# Patient Record
Sex: Female | Born: 1990 | Race: Black or African American | Hispanic: No | Marital: Single | Smoking: Current every day smoker
Health system: Southern US, Community
[De-identification: ages and names within clinical notes are randomized; demographics above are authoritative.]

## PROBLEM LIST (undated history)

## (undated) DIAGNOSIS — N611 Abscess of the breast and nipple: Secondary | ICD-10-CM

## (undated) DIAGNOSIS — K589 Irritable bowel syndrome without diarrhea: Secondary | ICD-10-CM

## (undated) DIAGNOSIS — Z8709 Personal history of other diseases of the respiratory system: Secondary | ICD-10-CM

## (undated) HISTORY — DX: Irritable bowel syndrome, unspecified: K58.9

## (undated) HISTORY — DX: Abscess of the breast and nipple: N61.1

## (undated) HISTORY — PX: INCISION AND DRAINAGE BREAST ABSCESS: SUR672

---
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
## 2010-09-15 LAB — URINALYSIS, ROUTINE W REFLEX MICROSCOPIC
Nitrite: NEGATIVE
Protein, ur: 30 mg/dL — AB
Urobilinogen, UA: 1 mg/dL (ref 0.0–1.0)

## 2010-09-15 LAB — WET PREP, GENITAL

## 2010-09-15 LAB — URINE MICROSCOPIC-ADD ON

## 2010-09-15 LAB — POCT PREGNANCY, URINE: Preg Test, Ur: NEGATIVE

## 2010-09-15 LAB — RPR: RPR Ser Ql: NONREACTIVE

## 2011-02-24 ENCOUNTER — Emergency Department (HOSPITAL_COMMUNITY): Payer: BC Managed Care – PPO

## 2011-02-24 ENCOUNTER — Other Ambulatory Visit (HOSPITAL_COMMUNITY): Payer: Self-pay

## 2011-02-24 ENCOUNTER — Emergency Department (HOSPITAL_COMMUNITY)
Admission: EM | Admit: 2011-02-24 | Discharge: 2011-02-24 | Disposition: A | Discharge status: Home / Self Care | Payer: BC Managed Care – PPO | Attending: Emergency Medicine | Admitting: Emergency Medicine

## 2011-02-24 DIAGNOSIS — N644 Mastodynia: Secondary | ICD-10-CM | POA: Insufficient documentation

## 2011-02-24 DIAGNOSIS — N6009 Solitary cyst of unspecified breast: Secondary | ICD-10-CM

## 2011-02-24 LAB — DIFFERENTIAL
Eosinophils Absolute: 0.5 10*3/uL (ref 0.0–0.7)
Eosinophils Relative: 3 % (ref 0–5)
Lymphs Abs: 2.7 10*3/uL (ref 0.7–4.0)
Monocytes Relative: 8 % (ref 3–12)

## 2011-02-24 LAB — BASIC METABOLIC PANEL
Calcium: 9.4 mg/dL (ref 8.4–10.5)
Creatinine, Ser: 0.77 mg/dL (ref 0.50–1.10)
GFR calc Af Amer: >60 mL/min (ref >60)
GFR calc non Af Amer: >60 mL/min (ref >60)

## 2011-02-24 LAB — CBC
MCH: 26.7 pg (ref 26.0–34.0)
MCV: 77.8 fL — ABNORMAL LOW (ref 78.0–100.0)
Platelets: 311 10*3/uL (ref 150–400)
RDW: 14.3 % (ref 11.5–15.5)

## 2011-02-25 ENCOUNTER — Other Ambulatory Visit: Payer: Self-pay | Admitting: Family Medicine

## 2011-02-25 ENCOUNTER — Ambulatory Visit: Admit: 2011-02-25 | Payer: BC Managed Care – PPO

## 2011-02-25 ENCOUNTER — Ambulatory Visit
Admission: RE | Admit: 2011-02-25 | Discharge: 2011-02-25 | Disposition: A | Discharge status: Home / Self Care | Payer: BC Managed Care – PPO | Source: Ambulatory Visit | Attending: Family Medicine | Admitting: Family Medicine

## 2011-02-25 DIAGNOSIS — N6009 Solitary cyst of unspecified breast: Secondary | ICD-10-CM

## 2011-02-26 ENCOUNTER — Ambulatory Visit (INDEPENDENT_AMBULATORY_CARE_PROVIDER_SITE_OTHER): Payer: BC Managed Care – PPO | Admitting: Surgery

## 2011-02-26 ENCOUNTER — Ambulatory Visit (HOSPITAL_COMMUNITY)
Admission: AD | Admit: 2011-02-26 | Discharge: 2011-02-27 | Disposition: A | Discharge status: Home / Self Care | Payer: BC Managed Care – PPO | Source: Ambulatory Visit | Attending: General Surgery | Admitting: General Surgery

## 2011-02-26 ENCOUNTER — Encounter (INDEPENDENT_AMBULATORY_CARE_PROVIDER_SITE_OTHER): Payer: Self-pay | Admitting: Surgery

## 2011-02-26 ENCOUNTER — Ambulatory Visit
Admission: RE | Admit: 2011-02-26 | Discharge: 2011-02-26 | Disposition: A | Discharge status: Home / Self Care | Payer: BC Managed Care – PPO | Source: Ambulatory Visit | Attending: Family Medicine | Admitting: Family Medicine

## 2011-02-26 ENCOUNTER — Other Ambulatory Visit: Payer: Self-pay | Admitting: Family Medicine

## 2011-02-26 VITALS — BP 116/72 | HR 90 | Temp 96.0°F | Resp 18 | Ht 62.0 in | Wt 130.4 lb

## 2011-02-26 DIAGNOSIS — N61 Mastitis without abscess: Secondary | ICD-10-CM

## 2011-02-26 DIAGNOSIS — N6009 Solitary cyst of unspecified breast: Secondary | ICD-10-CM

## 2011-02-26 DIAGNOSIS — N611 Abscess of the breast and nipple: Secondary | ICD-10-CM

## 2011-02-26 DIAGNOSIS — J45909 Unspecified asthma, uncomplicated: Secondary | ICD-10-CM | POA: Insufficient documentation

## 2011-02-26 LAB — BASIC METABOLIC PANEL
Calcium: 9.8 mg/dL (ref 8.4–10.5)
GFR calc non Af Amer: >60 mL/min (ref >60)
Sodium: 132 mEq/L — ABNORMAL LOW (ref 135–145)

## 2011-02-26 LAB — SURGICAL PCR SCREEN
MRSA, PCR: NEGATIVE
Staphylococcus aureus: NEGATIVE

## 2011-02-26 NOTE — Patient Instructions (Signed)
Go directly to admitting at [facility].  Do not eat or drink anything.

## 2011-02-26 NOTE — Progress Notes (Signed)
Chief Complaint  Patient presents with  . Other    eval of right breast abscess     HPI Rachael Manning is a 20 y.o. femalePresents with a one-month history of a right breast abscess. This was initially treated at [facility] in [place] with antibiotics. The pain and erythema improved but the mass remained. Last week she started her period and this area flared up again. It has become exquisitely tender and swollen. She had a visit to the nurse department the other night where she underwent an ultrasound showing a 2-1/2 cm complex cystic lesion consistent with an abscess. An ultrasound-guided aspiration was attempted this morning but the patient is unable to tolerate this due to pain. She was sent to urgent office but again she is unable to tolerate any intervention due to the pain.HPI   Past Medical History  Diagnosis Date  . Asthma   . Chest pain     due to breast abscess   . Breast abscess     right breast   . Fever     Past Surgical History  Procedure Date  . Femur surgery 2007    femur reconstruction     History reviewed. No pertinent family history.  Social History History  Substance Use Topics  . Smoking status: Never Smoker   . Smokeless tobacco: Not on file  . Alcohol Use: No    No Known Allergies  Current Outpatient Prescriptions  Medication Sig Dispense Refill  . cephALEXin (KEFLEX) 500 MG capsule Take 500 mg by mouth 4 (four) times daily.        Marland Kitchen sulfamethoxazole-trimethoprim (BACTRIM DS) 800-160 MG per tablet Take 1 tablet by mouth 2 (two) times daily.          Review of Systems Review of Systems Right breast pain, otherwise negative Blood pressure 116/72, pulse 90, temperature 96 F (35.6 C), resp. rate 18, height 5\' 2"  (1.575 m), weight 130 lb 6 oz (59.138 kg).  Physical Exam Physical Exam WDWN in NAD Right breast - entire periareolar region is swollen and erythematous; exquisitely tender to palpation; fluctuance located at  3:00. Left breast normal Lungs - CTA B CV - RRR Abd - soft, NT  Data Reviewed Right breast ultrasound  Assessment    Right breast abscess - unable to tolerate drainage in the office    Plan    Admit to [facility] for incision and drainage under anesthesia tonight.       Frutoso Dimare K. 02/26/2011, 3:44 PM

## 2011-03-03 LAB — CULTURE, ROUTINE-ABSCESS

## 2011-03-03 NOTE — Op Note (Signed)
  [name], [name]          ACCOUNT NO.:  [id number]  MEDICAL RECORD NO.:  [id number]  LOCATION:  1526                         FACILITY:  [facility]  PHYSICIAN:  Thornton Park. Daphine Deutscher, MD  DATE OF BIRTH:  [birth year]  DATE OF PROCEDURE:  02/26/2011 DATE OF DISCHARGE:                              OPERATIVE REPORT   PREOPERATIVE INDICATIONS:  This is a 20 year old African American female from [place] who is a Consulting civil engineer at [employer], had a breast cellulitis/abscess treated with antibiotics, done in [place] last week, but this has gotten worse and pointed.  She was seen at the Breast Center earlier today, attempted aspiration, but she was very tender and was referred via our General office for breast abscess drainage.  DIAGNOSIS:  Right retroareolar breast abscess.  PROCEDURE:  Incision and drainage and culture of abscess with placement of iodophor packing.  ANESTHESIA:  General.  SURGEON:  Thornton Park. Daphine Deutscher, MD  DESCRIPTION OF PROCEDURE:  The patient was taken to OR #1 on Friday evening, February 26, 2011.  The breast was prepped with PCMX and draped sterilely.  It had been marked in the holding area that was obvious because of the degree of inflammation and there was an obvious abscess situated more medially in the breast.  We had frozen and inserted an 18-gauge needle and aspirated 2 cc of yellow creamy pus.  I placed this in an aerobic and anaerobic culture medium for cultures.  I then incised in a circumareolar fashion into this abscess and got plenty of pus.  I irrigated with saline and pressed it to try to get all this off out, and then irrigated more and then packed it with Iodoform gauze 1/4-inch about 4 inches in length.  Dressings were applied.  The patient was taken to the recovery room in satisfactory addition.  She will be kept overnight and given Zosyn IV and followed up for possible discharge tomorrow.     Thornton Park Daphine Deutscher, MD     MBM/MEDQ  D:  02/26/2011   T:  02/27/2011  Job:  [id number]  Electronically Signed by Luretha Murphy MD on 03/03/2011 07:32:49 AM

## 2011-03-05 LAB — ANAEROBIC CULTURE

## 2011-03-08 ENCOUNTER — Ambulatory Visit (INDEPENDENT_AMBULATORY_CARE_PROVIDER_SITE_OTHER): Payer: BC Managed Care – PPO | Admitting: Family Medicine

## 2011-03-08 ENCOUNTER — Encounter: Payer: Self-pay | Admitting: Family Medicine

## 2011-03-08 VITALS — BP 118/72 | HR 80 | Temp 98.8°F | Ht 62.0 in | Wt 130.5 lb

## 2011-03-08 DIAGNOSIS — N61 Mastitis without abscess: Secondary | ICD-10-CM

## 2011-03-08 DIAGNOSIS — N611 Abscess of the breast and nipple: Secondary | ICD-10-CM

## 2011-03-08 NOTE — Patient Instructions (Signed)
If you have any fevers, chills, nausea, abdominal pain make sure to call her to the emergency department. If you notice any redness, swelling, hardness, pus coming from around your nipple be sure to give [place] a call, come back or go to emergency department

## 2011-03-08 NOTE — Assessment & Plan Note (Signed)
Improving, no current infection. Healing well. Minimal amounts of wick still present. She is to continue her daily dressing changes. Recommended she removed the wick totally by middle this week. She is very hesitant to remove in clinic today due to the pain. Pain is controlled with Percocet though she does not like the Percocet because she states it makes her feel sick to her stomach and lightheaded. Recommend she cut Percocet in half and instructed how to do this. Also recommended she try 2 Tylenol or to 3 ibuprofen if she does not want to continue the Percocet. Gave warnings, red flags, reasons to return to clinic.

## 2011-03-08 NOTE — Progress Notes (Signed)
  Subjective:    Patient ID: Rachael Manning, female    DOB: Apr 26, 1991, 19 y.o.   MRN: [id number]  HPI 1.  Breast abscess:  [name] is a 20 year old female from [place] who is here in town attending [employer]. She had a breast abscess that started in June of this past year. She was started on oral antibiotics by her PCP in [place]. The abscess diminished but did not totally resolve. The middle of September the abscess return in her right breast became red, warm, very swollen, and there and tender to touch. She was initially seen at [facility] emergency department and referred to the breast center. In the breast Center she was referred to surgery and had incision and drainage of her abscess under anesthesia on September 22. She is referred here [facility] clinic by surgery.  Since the incision and drainage of her abscess, she's had no fevers, abdominal pain, nausea, vomiting. She states that her breast's still somewhat tender to touch but otherwise doing well. Feels much better since the drainage. Denies any redness or induration at the site.   Review of Systems See HPI above for review of systems.       Objective:   Physical Exam  Gen:  Alert, cooperative patient who appears stated age in no acute distress.  Vital signs reviewed. Skin:  Bandage in place of her right breast. It is clean dry and intact. Upon removal of bandage, noted iodoform packing with a wick extending from 1 cm in size, well-healing incised area medial to Right areola.  No induration or areas of fluctuance noted. No erythema no axillary tenderness or edema. Minimal tenderness to palpation      Assessment & Plan:

## 2011-03-11 ENCOUNTER — Other Ambulatory Visit: Payer: BC Managed Care – PPO

## 2011-03-12 ENCOUNTER — Other Ambulatory Visit: Payer: BC Managed Care – PPO

## 2011-06-19 ENCOUNTER — Emergency Department (HOSPITAL_COMMUNITY)
Admission: EM | Admit: 2011-06-19 | Discharge: 2011-06-19 | Disposition: A | Discharge status: Home / Self Care | Payer: BC Managed Care – PPO | Attending: Emergency Medicine | Admitting: Emergency Medicine

## 2011-06-19 DIAGNOSIS — N61 Mastitis without abscess: Secondary | ICD-10-CM | POA: Insufficient documentation

## 2011-06-19 DIAGNOSIS — N611 Abscess of the breast and nipple: Secondary | ICD-10-CM

## 2011-06-19 MED ORDER — DOXYCYCLINE HYCLATE 100 MG PO CAPS: 100.0000 mg | ORAL_CAPSULE | Freq: Two times a day (BID) | ORAL | Status: AC

## 2011-06-19 MED ORDER — HYDROCODONE-ACETAMINOPHEN 5-325 MG PO TABS: 2.0000 | ORAL_TABLET | ORAL | Status: AC | PRN

## 2011-06-19 NOTE — ED Notes (Signed)
Pt had a breast lump removed back in September, incision site well healed, pt believes the lump is back. Right breast is tender at the areolar area. Extreme tender to the touch.

## 2011-06-19 NOTE — ED Provider Notes (Signed)
History     CSN: [id number]  Arrival date & time 06/19/11  1105   First MD Initiated Contact with Patient 06/19/11 1157      No chief complaint on file.   (Consider location/radiation/quality/duration/timing/severity/associated sxs/prior treatment) HPI Comments: Patient reports that she has an abscess of her right breast in September, 2012.  An incision and drainage of the abscess was performed by General Surgery under anaesthesia.  She reports that the abscess did resolve after the procedure.  However, two days ago she noticed the abscess.  She reports that the area is tender to palpation and has become increasingly larger.  She denies any drainage.  No fever/chills.  The history is provided by the patient.    Past Medical History  Diagnosis Date  . Asthma   . Chest pain     due to breast abscess   . Breast abscess     right breast   . Fever     Past Surgical History  Procedure Date  . Femur surgery 2007    femur reconstruction     No family history on file.  History  Substance Use Topics  . Smoking status: Never Smoker   . Smokeless tobacco: Not on file  . Alcohol Use: No    OB History    Grav Para Term Preterm Abortions TAB SAB Ect Mult Living                  Review of Systems  Constitutional: Negative for fever and chills.  Respiratory: Negative for shortness of breath.   Cardiovascular: Negative for chest pain.  Gastrointestinal: Negative for nausea and vomiting.  Skin: Negative for color change, rash and wound.  Neurological: Negative for dizziness and syncope.    Allergies  Review of patient's allergies indicates no known allergies.  Home Medications   Current Outpatient Rx  Name Route Sig Dispense Refill  . SULFAMETHOXAZOLE-TMP DS 800-160 MG PO TABS Oral Take 1 tablet by mouth 2 (two) times daily.        BP 104/62  Pulse 93  Temp(Src) 99.1 F (37.3 C) (Oral)  Resp 18  SpO2 100%  LMP 06/03/2011  Physical Exam  Nursing note and  vitals reviewed. Constitutional: She is oriented to person, place, and time. She appears well-developed and well-nourished. No distress.  HENT:  Head: Normocephalic and atraumatic.  Cardiovascular: Normal rate, regular rhythm and normal heart sounds.   Pulmonary/Chest: Effort normal and breath sounds normal. No respiratory distress. Right breast exhibits mass and tenderness. Right breast exhibits no inverted nipple, no nipple discharge and no skin change.       Approximately 3 cm abscess of the right breast.  Abscess located at the medial portion of areola at 3 o'clock.  No surrounding erythema.  No drainage from the area.  Area tender to palpation.  Neurological: She is alert and oriented to person, place, and time.  Skin: Skin is warm and dry. She is not diaphoretic.  Psychiatric: She has a normal mood and affect.    ED Course  Procedures (including critical care time)  Labs Reviewed - No data to display No results found.   No diagnosis found.   Patient reported that she did not want I&D performed in the Emergency Department because she would like to be sedated during the procedure.    MDM  Patient refused I & D in the Emergency Department.  Patient placed on Doxycycline and instructed to follow up with General Surgery for treatment.  Pascal Lux Wingen 06/19/11 1628

## 2011-06-20 NOTE — ED Provider Notes (Signed)
Medical screening examination/treatment/procedure(s) were performed by non-physician practitioner and as supervising physician I was immediately available for consultation/collaboration.   Suzi Roots, MD 06/20/11 [phone]

## 2011-06-21 ENCOUNTER — Ambulatory Visit (INDEPENDENT_AMBULATORY_CARE_PROVIDER_SITE_OTHER): Payer: BC Managed Care – PPO | Admitting: General Surgery

## 2011-06-21 ENCOUNTER — Encounter (INDEPENDENT_AMBULATORY_CARE_PROVIDER_SITE_OTHER): Payer: Self-pay | Admitting: General Surgery

## 2011-06-21 VITALS — BP 111/81 | HR 81 | Temp 96.8°F | Resp 14 | Ht 63.0 in | Wt 135.0 lb

## 2011-06-21 DIAGNOSIS — N611 Abscess of the breast and nipple: Secondary | ICD-10-CM

## 2011-06-21 DIAGNOSIS — N61 Mastitis without abscess: Secondary | ICD-10-CM

## 2011-06-21 NOTE — Progress Notes (Signed)
Patient ID: Rachael Manning, female   DOB: 02/10/1991, 21 y.o.   MRN: [id number]  Chief Complaint  Patient presents with  . Breast Pain    HPI Rachael Manning is a 21 y.o. female.   HPI This is a 21-year-old female who last year had about a one-month history of a chronic subareolar right breast abscess. This was attempted to be treated with antibiotics. This did not work. She then underwent an attempt at ultrasound-guided aspiration but she cannot tolerate this due to pain. She had an ultrasound that showed a 2 and a centimeter complex cystic lesion at that point it September she was then admitted to the hospital by Dr. Morgan and underwent incision and drainage of a chronic right breast subareolar abscess. This was left open and healed over some time. Over the past week this area has recurred now. She reports a serious tender. She has no evidence of any nipple discharge. She denies any systemic findings such as fever. She comes in today to discuss her options. She states that she cannot tolerate an ultrasound-guided aspiration.  Past Medical History  Diagnosis Date  . Asthma   . Breast abscess     right breast   . Fever     Past Surgical History  Procedure Date  . Femur surgery 2007    femur reconstruction   . Breast surgery     right breast abscess i and d    History reviewed. No pertinent family history.  Social History History  Substance Use Topics  . Smoking status: Never Smoker   . Smokeless tobacco: Not on file  . Alcohol Use: No    No Known Allergies  Current Outpatient Prescriptions  Medication Sig Dispense Refill  . albuterol (PROVENTIL HFA;VENTOLIN HFA) 108 (90 BASE) MCG/ACT inhaler Inhale 2 puffs into the lungs every 4 (four) hours as needed. For shortness of breath.      . doxycycline (VIBRAMYCIN) 100 MG capsule Take 1 capsule (100 mg total) by mouth 2 (two) times daily.  20 capsule  0  . HYDROcodone-acetaminophen (NORCO) 5-325 MG per tablet Take 2  tablets by mouth every 4 (four) hours as needed for pain.  10 tablet  0  . naproxen sodium (ALEVE) 220 MG tablet Take 440 mg by mouth 2 (two) times daily with a meal. For menstrual cramps.      . sulfamethoxazole-trimethoprim (BACTRIM DS) 800-160 MG per tablet Take 1 tablet by mouth 2 (two) times daily.          Review of Systems Review of Systems  Constitutional: Negative for fever, chills and unexpected weight change.  HENT: Negative for hearing loss, congestion, sore throat, trouble swallowing and voice change.   Eyes: Negative for visual disturbance.  Respiratory: Negative for cough and wheezing.   Cardiovascular: Negative for chest pain, palpitations and leg swelling.  Gastrointestinal: Negative for nausea, vomiting, abdominal pain, diarrhea, constipation, blood in stool, abdominal distention and anal bleeding.  Genitourinary: Negative for hematuria, vaginal bleeding and difficulty urinating.  Musculoskeletal: Negative for arthralgias.  Skin: Negative for rash and wound.  Neurological: Negative for seizures, syncope and headaches.  Hematological: Negative for adenopathy. Does not bruise/bleed easily.  Psychiatric/Behavioral: Negative for confusion.    Blood pressure 111/81, pulse 81, temperature 96.8 F (36 C), temperature source Temporal, resp. rate 14, height 5' 3" (1.6 m), weight 135 lb (61.236 kg), last menstrual period 06/03/2011.  Physical Exam Physical Exam  Constitutional: She appears well-developed and well-nourished.  Neck: Neck supple.    Cardiovascular: Normal rate and regular rhythm.   Pulmonary/Chest: Effort normal and breath sounds normal. She has no wheezes. She has no rales. Right breast exhibits mass and tenderness. Right breast exhibits no inverted nipple, no nipple discharge and no skin change.    Lymphadenopathy:    She has no cervical adenopathy.      Assessment    Chronic right breast abscess    Plan    She does have a chronic right breast  abscess it has now recurred. She can tolerate anything either in the office or an ultrasound attempted this. I discussed with her going to the operating room and do an incision and drainage of a chronic breast abscess as well as a biopsy. I told her I think eventually she likely just needs her ducts removed in the retroareolar position if this continues. I discussed that I would believe this open.       Murvin Gift 06/21/2011, 3:43 PM    

## 2011-06-22 ENCOUNTER — Encounter (HOSPITAL_BASED_OUTPATIENT_CLINIC_OR_DEPARTMENT_OTHER): Payer: Self-pay | Admitting: *Deleted

## 2011-06-22 NOTE — Pre-Procedure Instructions (Signed)
To come for CBC  PCP - Dr. Larna Daughters, [facility], [place]    [phone]

## 2011-06-22 NOTE — Progress Notes (Signed)
Called pt ,  She is unable to come in for lab work. Advised pt to come in 8am early for lab to be done.

## 2011-06-23 ENCOUNTER — Encounter (HOSPITAL_BASED_OUTPATIENT_CLINIC_OR_DEPARTMENT_OTHER): Payer: Self-pay | Admitting: Anesthesiology

## 2011-06-23 ENCOUNTER — Other Ambulatory Visit (INDEPENDENT_AMBULATORY_CARE_PROVIDER_SITE_OTHER): Payer: Self-pay | Admitting: General Surgery

## 2011-06-23 ENCOUNTER — Encounter (HOSPITAL_BASED_OUTPATIENT_CLINIC_OR_DEPARTMENT_OTHER): Payer: Self-pay | Admitting: *Deleted

## 2011-06-23 ENCOUNTER — Encounter (HOSPITAL_BASED_OUTPATIENT_CLINIC_OR_DEPARTMENT_OTHER)
Admission: RE | Disposition: A | Discharge status: Home / Self Care | Payer: Self-pay | Source: Ambulatory Visit | Attending: General Surgery

## 2011-06-23 ENCOUNTER — Telehealth (INDEPENDENT_AMBULATORY_CARE_PROVIDER_SITE_OTHER): Payer: Self-pay

## 2011-06-23 ENCOUNTER — Ambulatory Visit (HOSPITAL_BASED_OUTPATIENT_CLINIC_OR_DEPARTMENT_OTHER)
Admission: RE | Admit: 2011-06-23 | Discharge: 2011-06-23 | Disposition: A | Discharge status: Home / Self Care | Payer: BC Managed Care – PPO | Source: Ambulatory Visit | Attending: General Surgery | Admitting: General Surgery

## 2011-06-23 ENCOUNTER — Ambulatory Visit (HOSPITAL_BASED_OUTPATIENT_CLINIC_OR_DEPARTMENT_OTHER): Payer: BC Managed Care – PPO | Admitting: Anesthesiology

## 2011-06-23 DIAGNOSIS — N61 Mastitis without abscess: Secondary | ICD-10-CM

## 2011-06-23 LAB — CBC
HCT: 37.5 % (ref 36.0–46.0)
MCH: 26.2 pg (ref 26.0–34.0)
MCHC: 33.1 g/dL (ref 30.0–36.0)
MCV: 79.3 fL (ref 78.0–100.0)
RDW: 14.3 % (ref 11.5–15.5)
WBC: 8.8 10*3/uL (ref 4.0–10.5)

## 2011-06-23 SURGERY — INCISION AND DRAINAGE, ABSCESS
Anesthesia: General | Site: Breast | Laterality: Right

## 2011-06-23 MED ORDER — DEXAMETHASONE SODIUM PHOSPHATE 4 MG/ML IJ SOLN
INTRAMUSCULAR | Status: DC | PRN
  Administered 2011-06-23: 10 mg via INTRAVENOUS

## 2011-06-23 MED ORDER — BUPIVACAINE HCL (PF) 0.25 % IJ SOLN
INTRAMUSCULAR | Status: DC | PRN
  Administered 2011-06-23: 10 mL

## 2011-06-23 MED ORDER — MIDAZOLAM HCL 5 MG/5ML IJ SOLN
INTRAMUSCULAR | Status: DC | PRN
  Administered 2011-06-23: 2 mg via INTRAVENOUS

## 2011-06-23 MED ORDER — LACTATED RINGERS IV SOLN
INTRAVENOUS | Status: DC
  Administered 2011-06-23 (×2): via INTRAVENOUS

## 2011-06-23 MED ORDER — PROMETHAZINE HCL 25 MG/ML IJ SOLN: 6.2500 mg | INTRAMUSCULAR | Status: DC | PRN

## 2011-06-23 MED ORDER — FENTANYL CITRATE 0.05 MG/ML IJ SOLN
INTRAMUSCULAR | Status: DC | PRN
  Administered 2011-06-23: 100 ug via INTRAVENOUS
  Administered 2011-06-23 (×2): 50 ug via INTRAVENOUS

## 2011-06-23 MED ORDER — PROPOFOL 10 MG/ML IV EMUL
INTRAVENOUS | Status: DC | PRN
  Administered 2011-06-23: 250 mg via INTRAVENOUS

## 2011-06-23 MED ORDER — CEFAZOLIN SODIUM 1-5 GM-% IV SOLN
1.0000 g | INTRAVENOUS | Status: AC
  Administered 2011-06-23: 1 g via INTRAVENOUS

## 2011-06-23 MED ORDER — FENTANYL CITRATE 0.05 MG/ML IJ SOLN
25.0000 ug | INTRAMUSCULAR | Status: DC | PRN
  Administered 2011-06-23 (×2): 25 ug via INTRAVENOUS
  Administered 2011-06-23: 50 ug via INTRAVENOUS
  Administered 2011-06-23 (×2): 25 ug via INTRAVENOUS

## 2011-06-23 MED ORDER — OXYCODONE-ACETAMINOPHEN 5-325 MG PO TABS
1.0000 | ORAL_TABLET | ORAL | Status: AC | PRN
  Administered 2011-06-23: 1 via ORAL

## 2011-06-23 MED ORDER — OXYCODONE-ACETAMINOPHEN 5-325 MG PO TABS: 1.0000 | ORAL_TABLET | ORAL | Status: AC | PRN

## 2011-06-23 SURGICAL SUPPLY — 40 items
BENZOIN TINCTURE PRP APPL 2/3 (GAUZE/BANDAGES/DRESSINGS) ×2 IMPLANT
BLADE SURG 15 STRL LF DISP TIS (BLADE) ×1 IMPLANT
BLADE SURG 15 STRL SS (BLADE) ×1
CANISTER SUCTION 1200CC (MISCELLANEOUS) ×2 IMPLANT
CHLORAPREP W/TINT 26ML (MISCELLANEOUS) ×2 IMPLANT
CLOTH BEACON ORANGE TIMEOUT ST (SAFETY) ×2 IMPLANT
COVER MAYO STAND STRL (DRAPES) ×2 IMPLANT
COVER TABLE BACK 60X90 (DRAPES) ×2 IMPLANT
DECANTER SPIKE VIAL GLASS SM (MISCELLANEOUS) IMPLANT
DERMABOND ADVANCED (GAUZE/BANDAGES/DRESSINGS)
DERMABOND ADVANCED .7 DNX12 (GAUZE/BANDAGES/DRESSINGS) IMPLANT
DRAPE PED LAPAROTOMY (DRAPES) ×2 IMPLANT
DRSG TEGADERM 4X4.75 (GAUZE/BANDAGES/DRESSINGS) IMPLANT
ELECT COATED BLADE 2.86 ST (ELECTRODE) ×2 IMPLANT
ELECT REM PT RETURN 9FT ADLT (ELECTROSURGICAL) ×2
ELECTRODE REM PT RTRN 9FT ADLT (ELECTROSURGICAL) ×1 IMPLANT
GAUZE PACKING IODOFORM 1/2 (PACKING) ×2 IMPLANT
GAUZE SPONGE 4X4 12PLY STRL LF (GAUZE/BANDAGES/DRESSINGS) ×2 IMPLANT
GLOVE BIO SURGEON STRL SZ7 (GLOVE) ×2 IMPLANT
GLOVE BIOGEL PI IND STRL 7.5 (GLOVE) ×1 IMPLANT
GLOVE BIOGEL PI INDICATOR 7.5 (GLOVE) ×1
GOWN PREVENTION PLUS XLARGE (GOWN DISPOSABLE) ×2 IMPLANT
NEEDLE HYPO 25X1 1.5 SAFETY (NEEDLE) ×2 IMPLANT
NS IRRIG 1000ML POUR BTL (IV SOLUTION) ×2 IMPLANT
PACK BASIN DAY SURGERY FS (CUSTOM PROCEDURE TRAY) ×2 IMPLANT
PENCIL BUTTON HOLSTER BLD 10FT (ELECTRODE) ×2 IMPLANT
SLEEVE SCD COMPRESS KNEE MED (MISCELLANEOUS) ×2 IMPLANT
SPONGE LAP 4X18 X RAY DECT (DISPOSABLE) ×2 IMPLANT
STRIP CLOSURE SKIN 1/2X4 (GAUZE/BANDAGES/DRESSINGS) ×2 IMPLANT
SUT MNCRL AB 4-0 PS2 18 (SUTURE) ×2 IMPLANT
SUT VIC AB 2-0 SH 27 (SUTURE) ×1
SUT VIC AB 2-0 SH 27XBRD (SUTURE) ×1 IMPLANT
SUT VIC AB 3-0 SH 27 (SUTURE) ×1
SUT VIC AB 3-0 SH 27X BRD (SUTURE) ×1 IMPLANT
SYR BULB 3OZ (MISCELLANEOUS) ×2 IMPLANT
SYR CONTROL 10ML LL (SYRINGE) ×2 IMPLANT
TOWEL OR 17X24 6PK STRL BLUE (TOWEL DISPOSABLE) ×2 IMPLANT
TUBE CONNECTING 20X1/4 (TUBING) ×2 IMPLANT
WATER STERILE IRR 1000ML POUR (IV SOLUTION) IMPLANT
YANKAUER SUCT BULB TIP NO VENT (SUCTIONS) ×2 IMPLANT

## 2011-06-23 NOTE — Op Note (Signed)
Preoperative diagnosis: Recurrent chronic right breast abscess Postoperative diagnosis: Same as above  Procedure: Incision and drainage of chronic right breast abscess Biopsy of likely chronic right breast mass  Surgeon: Dr. Harden Mo Anesthesia: Gen. Specimens: Right breast tissue to pathology this is not marked as it came out piecemeal from her what appeared to be a large abscess cavity Sponge needle count correct x2 at operation Disposition patient to recovery room in stable condition Estimated blood loss: Minimal  Indications: This is a 21 year old female who had a right breast abscess drained by one of my partners last year. She returns with about a week's history of the same. This could not be taken care of in the office and looked to be too big and 2 chronic decay tear with either either antibiotics or ultrasound aspiration. She and I discussed going to the operating room both to drain this area and take a biopsy of the cavity.  Procedure: After informed consent was obtained the patient was taken to the operating room. She was administered 1 g of intravenous cefazolin. Sequential compression devices were placed on her lower extremities  prior to induction with anesthesia. She was then placed under general anesthesia with an LMA. She had her left breast prepped and draped in the standard sterile surgical fashion. Surgical timeout was performed.  I then made an incision on the medial aspect of her right breast where her prior incision had been made. This was all very thick tissue and I carried this out to the abscess cavity. I did remove a couple small pieces of this abscess cavity. The abscess was actually immediately subareolar and I drained purulent fluid out of this. It was not a large abscess cavity. I debrided this as well. I then irrigated this copiously. There was no other further evidence of infection although there  was some indurated tissue. I then closed the dermis with 3-0  Vicryl and left about a centimeter opening . I packed the cavity with iodoform gauze. I was concerned that by closing it she would have another infection. I plan on pulling out the packing in 2 days and continue her on antibiotics and hopefully this will heal without any other problems. A sterile dressing was placed she tolerated this well and was transferred to recovery in a stable condition.

## 2011-06-23 NOTE — Anesthesia Postprocedure Evaluation (Signed)
  Anesthesia Post-op Note  Patient: Rachael Manning  Procedure(s) Performed:  INCISION AND DRAINAGE ABSCESS - Incision and drainage, possible ductal excision, right breast  Patient Location: PACU  Anesthesia Type: General  Level of Consciousness: awake, alert  and oriented  Airway and Oxygen Therapy: Patient Spontanous Breathing  Post-op Pain: mild  Post-op Assessment: Post-op Vital signs reviewed, Patient's Cardiovascular Status Stable, Respiratory Function Stable, Patent Airway, No signs of Nausea or vomiting, Adequate PO intake and Pain level controlled  Post-op Vital Signs: Reviewed and stable  Complications: No apparent anesthesia complications

## 2011-06-23 NOTE — H&P (View-Only) (Signed)
Patient ID: Rachael Manning, female   DOB: 1990/10/01, 21 y.o.   MRN: [id number]  Chief Complaint  Patient presents with  . Breast Pain    HPI Rachael Manning is a 21 y.o. female.   HPI This is a 21 year old female who last year had about a one-month history of a chronic subareolar right breast abscess. This was attempted to be treated with antibiotics. This did not work. She then underwent an attempt at ultrasound-guided aspiration but she cannot tolerate this due to pain. She had an ultrasound that showed a 2 and a centimeter complex cystic lesion at that point it September she was then admitted to the hospital by Dr. Lequita Halt and underwent incision and drainage of a chronic right breast subareolar abscess. This was left open and healed over some time. Over the past week this area has recurred now. She reports a serious tender. She has no evidence of any nipple discharge. She denies any systemic findings such as fever. She comes in today to discuss her options. She states that she cannot tolerate an ultrasound-guided aspiration.  Past Medical History  Diagnosis Date  . Asthma   . Breast abscess     right breast   . Fever     Past Surgical History  Procedure Date  . Femur surgery 2007    femur reconstruction   . Breast surgery     right breast abscess i and d    History reviewed. No pertinent family history.  Social History History  Substance Use Topics  . Smoking status: Never Smoker   . Smokeless tobacco: Not on file  . Alcohol Use: No    No Known Allergies  Current Outpatient Prescriptions  Medication Sig Dispense Refill  . albuterol (PROVENTIL HFA;VENTOLIN HFA) 108 (90 BASE) MCG/ACT inhaler Inhale 2 puffs into the lungs every 4 (four) hours as needed. For shortness of breath.      . doxycycline (VIBRAMYCIN) 100 MG capsule Take 1 capsule (100 mg total) by mouth 2 (two) times daily.  20 capsule  0  . HYDROcodone-acetaminophen (NORCO) 5-325 MG per tablet Take 2  tablets by mouth every 4 (four) hours as needed for pain.  10 tablet  0  . naproxen sodium (ALEVE) 220 MG tablet Take 440 mg by mouth 2 (two) times daily with a meal. For menstrual cramps.      . sulfamethoxazole-trimethoprim (BACTRIM DS) 800-160 MG per tablet Take 1 tablet by mouth 2 (two) times daily.          Review of Systems Review of Systems  Constitutional: Negative for fever, chills and unexpected weight change.  HENT: Negative for hearing loss, congestion, sore throat, trouble swallowing and voice change.   Eyes: Negative for visual disturbance.  Respiratory: Negative for cough and wheezing.   Cardiovascular: Negative for chest pain, palpitations and leg swelling.  Gastrointestinal: Negative for nausea, vomiting, abdominal pain, diarrhea, constipation, blood in stool, abdominal distention and anal bleeding.  Genitourinary: Negative for hematuria, vaginal bleeding and difficulty urinating.  Musculoskeletal: Negative for arthralgias.  Skin: Negative for rash and wound.  Neurological: Negative for seizures, syncope and headaches.  Hematological: Negative for adenopathy. Does not bruise/bleed easily.  Psychiatric/Behavioral: Negative for confusion.    Blood pressure 111/81, pulse 81, temperature 96.8 F (36 C), temperature source Temporal, resp. rate 14, height 5\' 3"  (1.6 m), weight 135 lb (61.236 kg), last menstrual period 06/03/2011.  Physical Exam Physical Exam  Constitutional: She appears well-developed and well-nourished.  Neck: Neck supple.  Cardiovascular: Normal rate and regular rhythm.   Pulmonary/Chest: Effort normal and breath sounds normal. She has no wheezes. She has no rales. Right breast exhibits mass and tenderness. Right breast exhibits no inverted nipple, no nipple discharge and no skin change.    Lymphadenopathy:    She has no cervical adenopathy.      Assessment    Chronic right breast abscess    Plan    She does have a chronic right breast  abscess it has now recurred. She can tolerate anything either in the office or an ultrasound attempted this. I discussed with her going to the operating room and do an incision and drainage of a chronic breast abscess as well as a biopsy. I told her I think eventually she likely just needs her ducts removed in the retroareolar position if this continues. I discussed that I would believe this open.       Daneen Volcy 06/21/2011, 3:43 PM

## 2011-06-23 NOTE — Anesthesia Procedure Notes (Signed)
Procedure Name: LMA Insertion Date/Time: 06/23/2011 10:11 AM Performed by: Gladys Damme Pre-anesthesia Checklist: Patient identified, Timeout performed, Emergency Drugs available, Suction available and Patient being monitored Patient Re-evaluated:Patient Re-evaluated prior to inductionOxygen Delivery Method: Circle System Utilized Preoxygenation: Pre-oxygenation with 100% oxygen Intubation Type: IV induction Ventilation: Mask ventilation without difficulty LMA: LMA inserted LMA Size: 4.0 Number of attempts: 2 Tube secured with: Tape Dental Injury: Teeth and Oropharynx as per pre-operative assessment  Comments: Small mouth

## 2011-06-23 NOTE — Telephone Encounter (Signed)
LMOM for pt to call me so I can give her a time for Friday nurse only to see me to have packing changed.

## 2011-06-23 NOTE — Anesthesia Preprocedure Evaluation (Signed)
Anesthesia Evaluation  Patient identified by MRN, date of birth, ID band Patient awake    Reviewed: Allergy & Precautions, H&P , NPO status , Patient's Chart, lab work & pertinent test results  History of Anesthesia Complications Negative for: history of anesthetic complications  Airway Mallampati: I TM Distance: >3 FB Neck ROM: Full    Dental No notable dental hx. (+) Teeth Intact   Pulmonary asthma (inhaler needed about once/month) ,  clear to auscultation  Pulmonary exam normal       Cardiovascular neg cardio ROS Regular Normal    Neuro/Psych Negative Neurological ROS     GI/Hepatic negative GI ROS, Neg liver ROS,   Endo/Other    Renal/GU negative Renal ROS     Musculoskeletal negative musculoskeletal ROS (+)   Abdominal   Peds  Hematology negative hematology ROS (+)   Anesthesia Other Findings   Reproductive/Obstetrics LMP 05/30/11, patient states no chance pregnant                           Anesthesia Physical Anesthesia Plan  ASA: I  Anesthesia Plan: General   Post-op Pain Management:    Induction: Intravenous  Airway Management Planned: LMA  Additional Equipment:   Intra-op Plan:   Post-operative Plan:   Informed Consent: I have reviewed the patients History and Physical, chart, labs and discussed the procedure including the risks, benefits and alternatives for the proposed anesthesia with the patient or authorized representative who has indicated his/her understanding and acceptance.   Dental advisory given  Plan Discussed with: CRNA and Surgeon  Anesthesia Plan Comments: (Plan routine monitors, GA- LMA OK)        Anesthesia Quick Evaluation

## 2011-06-23 NOTE — Interval H&P Note (Signed)
History and Physical Interval Note:  06/23/2011 10:00 AM  Rachael Manning  has presented today for surgery, with the diagnosis of chronic right breast abscess  The various methods of treatment have been discussed with the patient and family. After consideration of risks, benefits and other options for treatment, the patient has consented to  Procedure(s): INCISION AND DRAINAGE ABSCESS as a surgical intervention .  The patients' history has been reviewed, patient examined, no change in status, stable for surgery.  I have reviewed the patients' chart and labs.  Questions were answered to the patient's satisfaction.     Platon Arocho

## 2011-06-23 NOTE — Transfer of Care (Signed)
Immediate Anesthesia Transfer of Care Note  Patient: Rachael Manning  Procedure(s) Performed:  INCISION AND DRAINAGE ABSCESS - Incision and drainage, possible ductal excision, right breast  Patient Location: PACU  Anesthesia Type: General  Level of Consciousness: awake and alert   Airway & Oxygen Therapy: Patient Spontanous Breathing and Patient connected to face mask oxygen  Post-op Assessment: Report given to PACU RN and Post -op Vital signs reviewed and stable  Post vital signs: Reviewed and stable Filed Vitals:   06/23/11 0927  BP: 125/70  Pulse: 82  Temp: 36.8 C  Resp: 16    Complications: No apparent anesthesia complications

## 2011-06-24 ENCOUNTER — Telehealth (INDEPENDENT_AMBULATORY_CARE_PROVIDER_SITE_OTHER): Payer: Self-pay | Admitting: General Surgery

## 2011-06-28 ENCOUNTER — Encounter (INDEPENDENT_AMBULATORY_CARE_PROVIDER_SITE_OTHER): Payer: BC Managed Care – PPO | Admitting: General Surgery

## 2011-06-28 ENCOUNTER — Telehealth (INDEPENDENT_AMBULATORY_CARE_PROVIDER_SITE_OTHER): Payer: Self-pay

## 2011-06-28 NOTE — Telephone Encounter (Signed)
LMOM for pt to call to reschedule her f/u appt with Dr. Dwain Sarna.

## 2011-07-29 ENCOUNTER — Encounter (INDEPENDENT_AMBULATORY_CARE_PROVIDER_SITE_OTHER): Payer: Self-pay | Admitting: Surgery

## 2011-07-29 ENCOUNTER — Telehealth (INDEPENDENT_AMBULATORY_CARE_PROVIDER_SITE_OTHER): Payer: Self-pay

## 2011-07-29 ENCOUNTER — Ambulatory Visit (INDEPENDENT_AMBULATORY_CARE_PROVIDER_SITE_OTHER): Payer: BC Managed Care – PPO | Admitting: Surgery

## 2011-07-29 VITALS — BP 116/78 | HR 60 | Temp 97.9°F | Resp 16 | Ht 62.0 in | Wt 128.2 lb

## 2011-07-29 DIAGNOSIS — N61 Mastitis without abscess: Secondary | ICD-10-CM

## 2011-07-29 DIAGNOSIS — N611 Abscess of the breast and nipple: Secondary | ICD-10-CM

## 2011-07-29 MED ORDER — CEPHALEXIN 500 MG PO CAPS: 500.0000 mg | ORAL_CAPSULE | Freq: Three times a day (TID) | ORAL | Status: AC

## 2011-07-29 NOTE — Telephone Encounter (Signed)
8:35am Patient called to report that she had some bleeding last night from surgical site. However is has healed over and closed up as of this morning. Hx of chronic breast abscess. No fever.  Patient will leave voicemail for [facility] Dr. Dwain Sarna assistant.

## 2011-07-29 NOTE — Telephone Encounter (Signed)
She has missed all her postop appts.  Sounds like she could have a normal postop appt that she actually shows up to.

## 2011-07-29 NOTE — Patient Instructions (Signed)
Warm compresses several times daily for comfort.  tmg

## 2011-07-29 NOTE — Telephone Encounter (Signed)
Patient will be seen in urgent office today by Dr. Gerrit Friends.

## 2011-07-29 NOTE — Progress Notes (Signed)
Visit Diagnoses: 1. Abscess of right breast     HISTORY: The patient returns for followup. She had undergone incision and drainage and packing of a right breast abscess on June 23, 2011. She is scheduled for followup with my partner on August 02, 2011.  Patient continues to have discomfort in the right breast. She has seen a small amount of drainage from the incision. She presents today for evaluation.  EXAM: Surgical wound has healed. There is a small area of ulceration measuring 2 mm at the superior aspect of the incision. There is very slight induration. There is no fluctuance. There is no visible drainage.  IMPRESSION: Resolving right breast abscess, mild drainage, persistent discomfort  PLAN: Patient will start on Keflex 500 mg 3 times daily for one week. I have asked her to use warm compresses on the breast for comfort. Patient will return to see my partner on August 02, 2011, as scheduled.  Velora Heckler, MD, FACS General & Endocrine Surgery [facility]

## 2011-08-02 ENCOUNTER — Encounter (INDEPENDENT_AMBULATORY_CARE_PROVIDER_SITE_OTHER): Payer: Self-pay | Admitting: General Surgery

## 2011-08-02 ENCOUNTER — Ambulatory Visit (INDEPENDENT_AMBULATORY_CARE_PROVIDER_SITE_OTHER): Payer: BC Managed Care – PPO | Admitting: General Surgery

## 2011-08-02 VITALS — BP 120/78 | HR 84 | Temp 98.5°F | Resp 12 | Ht 62.0 in | Wt 128.6 lb

## 2011-08-02 DIAGNOSIS — Z09 Encounter for follow-up examination after completed treatment for conditions other than malignant neoplasm: Secondary | ICD-10-CM

## 2011-08-02 NOTE — Progress Notes (Signed)
Subjective:     Patient ID: Rachael Manning, female   DOB: 25-Oct-1990, 21 y.o.   MRN: [id number]  HPI This is a 21 year old female who had a recurrent right breast abscess I took to the operating room and drained and excised some of the chronic abscess cavity. This was done several weeks ago and she never followed up with me. She saw one of my partners last week with a little bit of drainage from her incision and was started on antibiotics and warm compresses. Her main complaint is that she has pain at the site of her incision as well as some poor tissue underneath the incision toward the nipple. She is otherwise without complaints. She denies any fevers or drainage today.  Review of Systems     Objective:   Physical Exam Right breast incision healed without infection, scar tissue present and tender but I do not think this is infection currently    Assessment:     S/p drainage/biopsy of right breast abscess    Plan:     I think this is postoperative in nature and not an infection.  I would prefer to follow up soon and reevaluate.  I think her symptom of pain should resolve with some time.  If not then we will consider an u/s as next step.

## 2011-08-06 ENCOUNTER — Encounter (INDEPENDENT_AMBULATORY_CARE_PROVIDER_SITE_OTHER): Payer: BC Managed Care – PPO | Admitting: General Surgery

## 2011-08-06 ENCOUNTER — Telehealth (INDEPENDENT_AMBULATORY_CARE_PROVIDER_SITE_OTHER): Payer: Self-pay

## 2011-08-06 NOTE — Telephone Encounter (Signed)
Pt calling in to tell Dr Dwain Sarna that her breast is now painful and she feels a lump. The pt has been using Aleve for pain but not working. The pt is requesting a pain Rx. I told the pt that I would speak with Dr Dwain Sarna.   Spoke to Dr Dwain Sarna and he advised the pt that she needed to be seen if her breast is that painful that she is requesting pain med. I made her an appt to see Dr Carolynne Edouard in urgent office today arrive at 4:30. The pt understands.

## 2011-08-17 ENCOUNTER — Ambulatory Visit (INDEPENDENT_AMBULATORY_CARE_PROVIDER_SITE_OTHER): Payer: BC Managed Care – PPO | Admitting: General Surgery

## 2011-08-17 ENCOUNTER — Other Ambulatory Visit (INDEPENDENT_AMBULATORY_CARE_PROVIDER_SITE_OTHER): Payer: Self-pay | Admitting: General Surgery

## 2011-08-17 ENCOUNTER — Encounter (INDEPENDENT_AMBULATORY_CARE_PROVIDER_SITE_OTHER): Payer: Self-pay | Admitting: General Surgery

## 2011-08-17 VITALS — BP 100/68 | HR 84 | Temp 98.2°F | Resp 18 | Ht 62.0 in | Wt 126.5 lb

## 2011-08-17 DIAGNOSIS — N6310 Unspecified lump in the right breast, unspecified quadrant: Secondary | ICD-10-CM

## 2011-08-17 DIAGNOSIS — Z09 Encounter for follow-up examination after completed treatment for conditions other than malignant neoplasm: Secondary | ICD-10-CM

## 2011-08-17 NOTE — Progress Notes (Signed)
Subjective:     Patient ID: Rachael Manning, female   DOB: Sep 30, 1990, 21 y.o.   MRN: [id number]  HPI This is a 21 year old female who had a chronic right breast abscess I saw her and ended up just incising and draining this with a biopsy of a portion of the cavity. This was fairly large and I could not excise this at the same time as I thought I would distort her breast tissue a fair amount. Since then she got better initially this healed and she has remained with a mass at that site she now reports is tender. There is otherwise no discharge erythema or any drainage from this area. The main complaint is that she is tender and it is still present.  Review of Systems     Objective:   Physical Exam     Assessment:     Right breast abscess, ? Recurrent     Plan:     I am not sure if this is recurrent abscess or chronic tender cavity. Will get u/s of this area prior to any discussion of excising this area.  I am concerned this will distort that nipple areolar complex

## 2011-08-18 ENCOUNTER — Telehealth (INDEPENDENT_AMBULATORY_CARE_PROVIDER_SITE_OTHER): Payer: Self-pay

## 2011-08-18 ENCOUNTER — Ambulatory Visit
Admission: RE | Admit: 2011-08-18 | Discharge: 2011-08-18 | Disposition: A | Discharge status: Home / Self Care | Payer: BC Managed Care – PPO | Source: Ambulatory Visit | Attending: General Surgery | Admitting: General Surgery

## 2011-08-18 DIAGNOSIS — N6310 Unspecified lump in the right breast, unspecified quadrant: Secondary | ICD-10-CM

## 2011-08-18 NOTE — Telephone Encounter (Signed)
Called pt to let her know the results from her xray. Per Dr Dwain Sarna the area is a tender mass but no abscess. Per Dr Dwain Sarna he recomends the pt just watching the area to see if it gets better or just excising the mass. The pt really is worried about the mass and doesn't know what to do at this time. The pt will call me on Tuesday when Dr Dwain Sarna is back in the office to let me know what she has decided about the area.

## 2011-08-20 ENCOUNTER — Telehealth (INDEPENDENT_AMBULATORY_CARE_PROVIDER_SITE_OTHER): Payer: Self-pay

## 2011-08-20 NOTE — Telephone Encounter (Signed)
Pt left me another v.m. Message about her having a lot more pain since she saw Dr Dwain Sarna on 08-17-11. The pt has been doing the warm compresses and taking Aleve with no relief. I advised the pt that Dr Dwain Sarna is still out of the office and with it being late in the day I couldn't offer anything except if she is in that much pain she needs to go to the ER to see if she has a breast abscess now. The pt understands. I notified the pt that I still will talk to Dr Dwain Sarna when he gets back and call her Tuesday.

## 2011-08-24 ENCOUNTER — Telehealth (INDEPENDENT_AMBULATORY_CARE_PROVIDER_SITE_OTHER): Payer: Self-pay

## 2011-08-24 DIAGNOSIS — Z872 Personal history of diseases of the skin and subcutaneous tissue: Secondary | ICD-10-CM

## 2011-08-24 MED ORDER — HYDROCODONE-ACETAMINOPHEN 5-500 MG PO TABS: 1.0000 | ORAL_TABLET | ORAL | Status: DC | PRN

## 2011-08-24 NOTE — Telephone Encounter (Signed)
Returned Rachael Manning's call. The Rachael Manning is wanting some pain med. And Dr Dwain Sarna said we could call some Vicodin 5/500 #20 phoned to [facility] on [address]. The Rachael Manning really has some more questions about the breast area and having it excised so I made her a f/u appt to see Dr Dwain Sarna.

## 2011-09-10 ENCOUNTER — Telehealth (INDEPENDENT_AMBULATORY_CARE_PROVIDER_SITE_OTHER): Payer: Self-pay | Admitting: General Surgery

## 2011-09-10 NOTE — Telephone Encounter (Signed)
It doesn't look like patient has had surgery since January. Not sure what pathology report.

## 2011-09-24 ENCOUNTER — Ambulatory Visit (INDEPENDENT_AMBULATORY_CARE_PROVIDER_SITE_OTHER): Payer: BC Managed Care – PPO | Admitting: General Surgery

## 2011-09-24 ENCOUNTER — Encounter (INDEPENDENT_AMBULATORY_CARE_PROVIDER_SITE_OTHER): Payer: Self-pay | Admitting: General Surgery

## 2011-09-24 VITALS — BP 106/68 | HR 100 | Temp 99.0°F | Resp 22 | Ht 62.0 in | Wt 129.4 lb

## 2011-09-24 DIAGNOSIS — N611 Abscess of the breast and nipple: Secondary | ICD-10-CM

## 2011-09-24 DIAGNOSIS — N61 Mastitis without abscess: Secondary | ICD-10-CM

## 2011-09-24 MED ORDER — DOXYCYCLINE HYCLATE 100 MG PO TABS: 100.0000 mg | ORAL_TABLET | Freq: Two times a day (BID) | ORAL | Status: DC

## 2011-09-24 NOTE — Progress Notes (Signed)
Subjective:     Patient ID: Rachael Manning, female   DOB: 13-Nov-1990, 21 y.o.   MRN: [id number]  HPI This is a 21 year old female I've been following her for a while with a right breast abscess. I incised and drained what was a very large breast abscess before. She has had some thickening at this site for a long time. Ultrasound shows a thickening and not a focal abscess. Today she returns for followup in this area is still present as a mass and it was a little bit red overlying it today as well. She reports pain at this site also.  Review of Systems     Objective:   Physical Exam  Vitals reviewed. Constitutional: She appears well-developed and well-nourished.  Pulmonary/Chest: Right breast exhibits mass.         Assessment:     Chronic right breast abscess    Plan:    I think she just has a mass that is a result of this chronic abscess at this point. It was tender and it is mildly red overlying it. I think that the best plan at this point she is going to be to excise this area. I think I can do this and have her maintain the shape of her breast pretty well. Her incision she be able to be placed at her areolar border. We discussed these options I think we've tried everything conservatively at this point will be to proceed to excise this.

## 2011-09-28 ENCOUNTER — Encounter (HOSPITAL_COMMUNITY): Payer: Self-pay | Admitting: Pharmacy Technician

## 2011-09-28 ENCOUNTER — Telehealth (INDEPENDENT_AMBULATORY_CARE_PROVIDER_SITE_OTHER): Payer: Self-pay | Admitting: General Surgery

## 2011-09-28 NOTE — Telephone Encounter (Signed)
Pt calling to request pain meds to bridge to surgery on 10/05/11.  Paged Dr. Dwain Sarna; ordered Vicodin per protocol.  Hydrocodone 5/325 mg; #30, 1 po q 4-6 hr prn pain, NO refill--called to [facility]:  [phone].  Pt is aware.

## 2011-09-29 ENCOUNTER — Inpatient Hospital Stay (HOSPITAL_COMMUNITY)
Admission: AD | Admit: 2011-09-29 | Discharge: 2011-10-01 | DRG: 276 | Disposition: A | Discharge status: Home / Self Care | Payer: BC Managed Care – PPO | Source: Ambulatory Visit | Attending: Surgery | Admitting: Surgery

## 2011-09-29 ENCOUNTER — Ambulatory Visit (INDEPENDENT_AMBULATORY_CARE_PROVIDER_SITE_OTHER): Payer: BC Managed Care – PPO | Admitting: General Surgery

## 2011-09-29 ENCOUNTER — Encounter (HOSPITAL_COMMUNITY): Admission: AD | Disposition: A | Discharge status: Home / Self Care | Payer: Self-pay | Source: Ambulatory Visit

## 2011-09-29 ENCOUNTER — Encounter (HOSPITAL_COMMUNITY): Payer: Self-pay | Admitting: Anesthesiology

## 2011-09-29 ENCOUNTER — Encounter (INDEPENDENT_AMBULATORY_CARE_PROVIDER_SITE_OTHER): Payer: Self-pay | Admitting: General Surgery

## 2011-09-29 ENCOUNTER — Encounter (HOSPITAL_COMMUNITY): Payer: Self-pay | Admitting: Surgery

## 2011-09-29 ENCOUNTER — Inpatient Hospital Stay (HOSPITAL_COMMUNITY): Payer: BC Managed Care – PPO | Admitting: Anesthesiology

## 2011-09-29 VITALS — BP 116/70 | HR 84 | Temp 97.8°F | Resp 12 | Ht 62.0 in | Wt 128.0 lb

## 2011-09-29 DIAGNOSIS — N611 Abscess of the breast and nipple: Secondary | ICD-10-CM

## 2011-09-29 DIAGNOSIS — N61 Mastitis without abscess: Secondary | ICD-10-CM

## 2011-09-29 DIAGNOSIS — J45909 Unspecified asthma, uncomplicated: Secondary | ICD-10-CM | POA: Diagnosis present

## 2011-09-29 HISTORY — PX: BREAST BIOPSY: SHX20

## 2011-09-29 LAB — CBC
MCH: 26.1 pg (ref 26.0–34.0)
MCHC: 32.5 g/dL (ref 30.0–36.0)
MCV: 80.3 fL (ref 78.0–100.0)
Platelets: 356 10*3/uL (ref 150–400)
RDW: 14.2 % (ref 11.5–15.5)

## 2011-09-29 SURGERY — BREAST BIOPSY
Anesthesia: General | Laterality: Right

## 2011-09-29 MED ORDER — ONDANSETRON HCL 4 MG/2ML IJ SOLN
4.0000 mg | Freq: Four times a day (QID) | INTRAMUSCULAR | Status: DC | PRN
  Administered 2011-09-30 (×2): 4 mg via INTRAVENOUS
  Filled 2011-09-29 (×2): qty 2

## 2011-09-29 MED ORDER — MAGNESIUM HYDROXIDE 400 MG/5ML PO SUSP: 30.0000 mL | Freq: Two times a day (BID) | ORAL | Status: DC | PRN

## 2011-09-29 MED ORDER — CEFAZOLIN SODIUM 1-5 GM-% IV SOLN: 1.0000 g | INTRAVENOUS | Status: DC

## 2011-09-29 MED ORDER — PIPERACILLIN-TAZOBACTAM 3.375 G IVPB
3.3750 g | Freq: Three times a day (TID) | INTRAVENOUS | Status: DC
  Administered 2011-09-29 – 2011-10-01 (×5): 3.375 g via INTRAVENOUS
  Filled 2011-09-29 (×8): qty 50

## 2011-09-29 MED ORDER — MIDAZOLAM HCL 5 MG/5ML IJ SOLN
INTRAMUSCULAR | Status: DC | PRN
  Administered 2011-09-29: 2 mg via INTRAVENOUS

## 2011-09-29 MED ORDER — ALUM & MAG HYDROXIDE-SIMETH 200-200-20 MG/5ML PO SUSP: 30.0000 mL | Freq: Four times a day (QID) | ORAL | Status: DC | PRN

## 2011-09-29 MED ORDER — ONDANSETRON HCL 4 MG/2ML IJ SOLN
INTRAMUSCULAR | Status: DC | PRN
  Administered 2011-09-29: 4 mg via INTRAVENOUS

## 2011-09-29 MED ORDER — SODIUM CHLORIDE 0.9 % IV SOLN
3.0000 g | INTRAVENOUS | Status: DC
  Filled 2011-09-29 (×2): qty 3

## 2011-09-29 MED ORDER — BUPIVACAINE-EPINEPHRINE 0.25% -1:200000 IJ SOLN
INTRAMUSCULAR | Status: DC | PRN
  Administered 2011-09-29: 25 mL

## 2011-09-29 MED ORDER — DIPHENHYDRAMINE HCL 50 MG/ML IJ SOLN: 12.5000 mg | Freq: Four times a day (QID) | INTRAMUSCULAR | Status: DC | PRN

## 2011-09-29 MED ORDER — FENTANYL CITRATE 0.05 MG/ML IJ SOLN
INTRAMUSCULAR | Status: DC | PRN
  Administered 2011-09-29 (×5): 50 ug via INTRAVENOUS

## 2011-09-29 MED ORDER — ONDANSETRON HCL 4 MG/2ML IJ SOLN: 4.0000 mg | Freq: Four times a day (QID) | INTRAMUSCULAR | Status: DC | PRN

## 2011-09-29 MED ORDER — FENTANYL CITRATE 0.05 MG/ML IJ SOLN
25.0000 ug | INTRAMUSCULAR | Status: DC | PRN
  Administered 2011-09-29 (×4): 25 ug via INTRAVENOUS

## 2011-09-29 MED ORDER — VANCOMYCIN HCL IN DEXTROSE 1-5 GM/200ML-% IV SOLN
1000.0000 mg | Freq: Two times a day (BID) | INTRAVENOUS | Status: DC
  Administered 2011-09-29 – 2011-09-30 (×3): 1000 mg via INTRAVENOUS
  Filled 2011-09-29 (×7): qty 200

## 2011-09-29 MED ORDER — SODIUM CHLORIDE 0.9 % IJ SOLN: 3.0000 mL | Freq: Two times a day (BID) | INTRAMUSCULAR | Status: DC

## 2011-09-29 MED ORDER — MUPIROCIN 2 % EX OINT
TOPICAL_OINTMENT | Freq: Two times a day (BID) | CUTANEOUS | Status: DC
  Administered 2011-09-29: 22:00:00 via NASAL
  Filled 2011-09-29: qty 22

## 2011-09-29 MED ORDER — ALBUTEROL SULFATE HFA 108 (90 BASE) MCG/ACT IN AERS
2.0000 | INHALATION_SPRAY | RESPIRATORY_TRACT | Status: DC | PRN
  Filled 2011-09-29: qty 6.7

## 2011-09-29 MED ORDER — ACETAMINOPHEN 325 MG PO TABS: 650.0000 mg | ORAL_TABLET | ORAL | Status: DC | PRN

## 2011-09-29 MED ORDER — MAGIC MOUTHWASH
15.0000 mL | Freq: Four times a day (QID) | ORAL | Status: DC | PRN
  Filled 2011-09-29: qty 15

## 2011-09-29 MED ORDER — PROPOFOL 10 MG/ML IV EMUL
INTRAVENOUS | Status: DC | PRN
  Administered 2011-09-29: 150 mg via INTRAVENOUS

## 2011-09-29 MED ORDER — OXYCODONE HCL 5 MG PO TABS
5.0000 mg | ORAL_TABLET | ORAL | Status: DC | PRN
  Administered 2011-09-29 – 2011-10-01 (×7): 10 mg via ORAL
  Filled 2011-09-29 (×7): qty 2

## 2011-09-29 MED ORDER — LACTATED RINGERS IV SOLN
INTRAVENOUS | Status: DC | PRN
  Administered 2011-09-29: 17:00:00 via INTRAVENOUS

## 2011-09-29 MED ORDER — AMPICILLIN-SULBACTAM SODIUM 3 (2-1) G IJ SOLR
3.0000 g | INTRAMUSCULAR | Status: DC | PRN
  Administered 2011-09-29: 3 g via INTRAVENOUS

## 2011-09-29 MED ORDER — HYDROMORPHONE BOLUS VIA INFUSION: 0.5000 mg | INTRAVENOUS | Status: DC | PRN

## 2011-09-29 MED ORDER — SODIUM CHLORIDE 0.9 % IV SOLN: 250.0000 mL | INTRAVENOUS | Status: DC | PRN

## 2011-09-29 MED ORDER — NAPROXEN 500 MG PO TABS
500.0000 mg | ORAL_TABLET | Freq: Two times a day (BID) | ORAL | Status: DC
  Administered 2011-09-29 – 2011-09-30 (×2): 500 mg via ORAL
  Filled 2011-09-29 (×7): qty 1

## 2011-09-29 MED ORDER — SODIUM CHLORIDE 0.9 % IJ SOLN: 3.0000 mL | INTRAMUSCULAR | Status: DC | PRN

## 2011-09-29 MED ORDER — LACTATED RINGERS IV SOLN
INTRAVENOUS | Status: DC
  Administered 2011-09-29: 15:00:00 via INTRAVENOUS

## 2011-09-29 MED ORDER — 0.9 % SODIUM CHLORIDE (POUR BTL) OPTIME
TOPICAL | Status: DC | PRN
  Administered 2011-09-29: 1000 mL

## 2011-09-29 MED ORDER — ACETAMINOPHEN 650 MG RE SUPP: 650.0000 mg | RECTAL | Status: DC | PRN

## 2011-09-29 SURGICAL SUPPLY — 41 items
BENZOIN TINCTURE PRP APPL 2/3 (GAUZE/BANDAGES/DRESSINGS) IMPLANT
BINDER BREAST LRG (GAUZE/BANDAGES/DRESSINGS) ×2 IMPLANT
BINDER BREAST XLRG (GAUZE/BANDAGES/DRESSINGS) IMPLANT
BLADE SURG 10 STRL SS (BLADE) IMPLANT
CANISTER SUCTION 2500CC (MISCELLANEOUS) ×2 IMPLANT
CLOTH BEACON ORANGE TIMEOUT ST (SAFETY) ×2 IMPLANT
CONT SPEC 4OZ CLIKSEAL STRL BL (MISCELLANEOUS) IMPLANT
COVER SURGICAL LIGHT HANDLE (MISCELLANEOUS) ×2 IMPLANT
DECANTER SPIKE VIAL GLASS SM (MISCELLANEOUS) IMPLANT
DEVICE DUBIN SPECIMEN MAMMOGRA (MISCELLANEOUS) IMPLANT
DRAPE LAPAROSCOPIC ABDOMINAL (DRAPES) IMPLANT
ELECT CAUTERY BLADE 6.4 (BLADE) ×2 IMPLANT
ELECT REM PT RETURN 9FT ADLT (ELECTROSURGICAL) ×2
ELECTRODE REM PT RTRN 9FT ADLT (ELECTROSURGICAL) ×1 IMPLANT
GAUZE PACKING IODOFORM 1/2 (PACKING) ×2 IMPLANT
GLOVE BIOGEL PI IND STRL 8 (GLOVE) ×1 IMPLANT
GLOVE BIOGEL PI INDICATOR 8 (GLOVE) ×1
GLOVE ECLIPSE 8.0 STRL XLNG CF (GLOVE) ×2 IMPLANT
GOWN PREVENTION PLUS XLARGE (GOWN DISPOSABLE) ×2 IMPLANT
GOWN STRL NON-REIN LRG LVL3 (GOWN DISPOSABLE) ×2 IMPLANT
KIT BASIN OR (CUSTOM PROCEDURE TRAY) ×2 IMPLANT
KIT ROOM TURNOVER OR (KITS) ×2 IMPLANT
MARGIN MAP 10MM (MISCELLANEOUS) IMPLANT
NEEDLE HYPO 25GX1X1/2 BEV (NEEDLE) ×2 IMPLANT
NS IRRIG 1000ML POUR BTL (IV SOLUTION) ×2 IMPLANT
PACK SURGICAL SETUP 50X90 (CUSTOM PROCEDURE TRAY) ×2 IMPLANT
PAD ARMBOARD 7.5X6 YLW CONV (MISCELLANEOUS) IMPLANT
PENCIL BUTTON HOLSTER BLD 10FT (ELECTRODE) ×2 IMPLANT
SPONGE GAUZE 4X4 12PLY (GAUZE/BANDAGES/DRESSINGS) ×2 IMPLANT
SPONGE LAP 4X18 X RAY DECT (DISPOSABLE) IMPLANT
STRIP CLOSURE SKIN 1/2X4 (GAUZE/BANDAGES/DRESSINGS) IMPLANT
SUT MON AB 4-0 PC3 18 (SUTURE) IMPLANT
SUT VIC AB 3-0 SH 27 (SUTURE)
SUT VIC AB 3-0 SH 27XBRD (SUTURE) IMPLANT
SYR CONTROL 10ML LL (SYRINGE) ×2 IMPLANT
TOWEL OR 17X24 6PK STRL BLUE (TOWEL DISPOSABLE) ×2 IMPLANT
TOWEL OR 17X26 10 PK STRL BLUE (TOWEL DISPOSABLE) ×2 IMPLANT
TROCAR FALLER TUNNELING (TROCAR) IMPLANT
TUBE CONNECTING 12X1/4 (SUCTIONS) ×2 IMPLANT
WATER STERILE IRR 1000ML POUR (IV SOLUTION) IMPLANT
YANKAUER SUCT BULB TIP NO VENT (SUCTIONS) ×2 IMPLANT

## 2011-09-29 NOTE — Discharge Instructions (Signed)

## 2011-09-29 NOTE — Progress Notes (Signed)
Patient ID: Rachael Manning, female   DOB: 10/16/1990, 21 y.o.   MRN: [id number]  Chief Complaint  Patient presents with  . Abscess    eval poss right breast abscess    HPI Rachael Manning is a 21 y.o. female.   HPI This is a 21 year old female who has a previous history of a right breast abscess drained by Dr. Daphine Deutscher. I then saw her in our urgent  office and she had a recurrence of this. I took her to the operating room as she would not tolerate drainage in the office and I drained and large right breast abscess. I let this heal by secondary intention. She ended up having a small tender nodule that appeared to be just residual abscess cavity. I had an  ultrasound of this and it did not show really any abscess or a fluid collection. I actually have her on the schedule for next week to excise this mass and ductal system.  She returns today with enlarged swollen tender mass that is a recurrent abscess.  Past Medical History  Diagnosis Date  . Breast abscess 06/2011    right breast   . Asthma     prn inhaler    Past Surgical History  Procedure Date  . 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 Breast surgery 02/26/2011    I & D right breast abscess    History reviewed. No pertinent family history.  Social History History  Substance Use Topics  . Smoking status: Never Smoker   . Smokeless tobacco: Never Used  . Alcohol Use: Yes     occasionally    Allergies  Allergen Reactions  . Mushroom Extract Complex Swelling    SWELLING OF LIPS, THROAT CLOSES    Current Outpatient Prescriptions  Medication Sig Dispense Refill  . albuterol (PROVENTIL HFA;VENTOLIN HFA) 108 (90 BASE) MCG/ACT inhaler Inhale 2 puffs into the lungs every 4 (four) hours as needed. For shortness of breath.      . doxycycline (VIBRA-TABS) 100 MG tablet Take 1 tablet (100 mg total) by mouth 2 (two) times daily.  14 tablet  0    Review of Systems Review of Systems  Blood pressure 116/70, pulse  84, temperature 97.8 F (36.6 C), temperature source Temporal, resp. rate 12, height 5\' 2"  (1.575 m), weight 128 lb (58.06 kg).  Physical Exam Physical Exam  Vitals reviewed. Constitutional: She appears well-developed and well-nourished.  Cardiovascular: Normal rate, regular rhythm and normal heart sounds.   Pulmonary/Chest: Effort normal. She has wheezes. She has no rales. Right breast exhibits mass and tenderness.      RIGHT BREAST ULTRASOUND  Comparison: 02/26/2011  On physical exam, I palpate a small area of thickening at 3 o'clock  in the right subareolar region. The patient's right nipple and  areola are extremely tender and she can barely tolerate the  pressure of the transducer.  Findings: Ultrasound is performed, showing focal thickening of the  skin of the areola at 3 o'clock corresponding to the palpable  finding. This is approximately 1 x 2 cm in size. No subareolar  fluid collection to suggest abscess is identified.  IMPRESSION:  Focal skin thickening at 3 o'clock in the right areola  corresponding to the area of the patient's pain and lump. No  subareolar fluid collection to suggest abscess identified.    Assessment    Recurrent right breast abscess    Plan    I had her scheduled to excise  a chronic mass from her previous abscess. In the interim she has developed a recurrent abscess.  I think this needs to be incised and drained.  She will not tolerate this in the office.  I will plan on admitting her and getting her to operating room for incision and drainage of this abscess.       Jocsan Mcginley 09/29/2011, 10:02 AM

## 2011-09-29 NOTE — Transfer of Care (Signed)
Immediate Anesthesia Transfer of Care Note  Patient: Rachael Manning  Procedure(s) Performed: Procedure(s) (LRB): BREAST BIOPSY (Right)  Patient Location: PACU  Anesthesia Type: General  Level of Consciousness: awake, alert , oriented and patient cooperative  Airway & Oxygen Therapy: Patient Spontanous Breathing  Post-op Assessment: Report given to PACU RN, Post -op Vital signs reviewed and stable and Patient moving all extremities X 4  Post vital signs: Reviewed and stable  Complications: No apparent anesthesia complications

## 2011-09-29 NOTE — Progress Notes (Signed)
Patient has inflamed. The area older mass consistent with recurrent breast abscess. I recommended drainage.  The anatomy and physiology of breast abscesses was discussed. Pathophysiology of SQ abscess, possible progression to fasciitis & sepsis, etc discussed . I stressed good hygiene & wound care.  Possible redebridement was discussed as well.  Possibility of recurrence was discussed. Risks, benefits, alternatives were discussed. I noted a good likelihood this will help address the problem.   Risks of anesthesia and other risks discussed. Questions answered.  The patient does wish to proceed.

## 2011-09-29 NOTE — Op Note (Addendum)
09/29/2011  5:14 PM  PATIENT:  Rachael Manning  21 y.o. female  Patient Care Team: Provider Not In System as PCP - General  PRE-OPERATIVE DIAGNOSIS:  right breast abscess  POST-OPERATIVE DIAGNOSIS:  right breast abscess  PROCEDURE:  Procedure(s): I&D of right breast abscess  SURGEON:  Surgeon(s):  Ardeth Sportsman, MD  ASSISTANT: none   ANESTHESIA:   local and general  EBL:  Total I/O In: 500 [I.V.:500] Out: -   Delay start of Pharmacological VTE agent (>24hrs) due to surgical blood loss or risk of bleeding:  no  DRAINS: none   SPECIMEN:  Aspirate  DISPOSITION OF SPECIMEN:  Micro  COUNTS:  YES  PLAN OF CARE: Admit for overnight observation  PATIENT DISPOSITION:  PACU - hemodynamically stable.  INDICATION: Patient is a young female with a prior right breast abscess. She's followed by Dr. Caryl Ada of our group. She's had a firm area with some calcifications. Plan was for consideration of elective resection. However, she developed pain and swelling. She developed evidence of an abscess. Given its size and location, it was not felt to be more comfortable to do in the office only.  The anatomy and physiology of skin abscesses was discussed. Pathophysiology of SQ abscess, possible progression to fasciitis & sepsis, etc discussed . I stressed good hygiene & wound care.  Possible redebridement was discussed as well.  Possibility of recurrence was discussed. Risks, benefits, alternatives were discussed. I noted a good likelihood this will help address the problem.   Risks of anesthesia and other risks discussed. Questions answered.  The patient wished to proceed.  OR FINDINGS: She had a 4 x 4 by 6 cm primarily retroareolar right breast abscess.  DESCRIPTION:   Informed consent was confirmed. The patient underwent general anesthesia with a laryngopharyngeal mask airway without difficulty. She was positioned supine. She received IV Unasyn. She had her right chest  prepped and draped in sterile fashion. Surgical tunnel confirmed or plan.  He is 18-gauge needle to aspirate the most indurated area and aspirated 2mL frank foul pus. We placed that for aerobic anaerobic cultures. I made a curvilinear incision at the area overlying over the fluctuance in the right upper inner quadrant. I encountered an obvious cavity. I aspirated frank pus. I irrigated the wound with 1 liter liter saline. Ensured hemostasis. I placed a field block with quarter percent bupivacaine with epinephrine. I packed the wound with quarter-inch iodoform NU-Gauze.  Patient is extubated. & in recovery room. I am looking for family to discuss postoperative findings. I think she would benefit from overnight antibiotics and make sure she can tolerate dressing changes given the extremely sensitive location.

## 2011-09-29 NOTE — Progress Notes (Signed)
ANTIBIOTIC CONSULT NOTE - INITIAL  Pharmacy Consult for Zosyn, Vancomycin Indication: rt breast abscess  Allergies  Allergen Reactions  . Mushroom Extract Complex Swelling    SWELLING OF LIPS, THROAT CLOSES    Patient Measurements: Height: 5' 1.81" (157 cm) Weight: 128 lb (58.06 kg) IBW/kg (Calculated) : 49.67  Adjusted Body Weight:   Vital Signs: Temp: 98.2 F (36.8 C) (04/24 1843) Temp src: Oral (04/24 1406) BP: 109/50 mmHg (04/24 1827) Pulse Rate: 91  (04/24 1843) Intake/Output from previous day:   Intake/Output from this shift:    Labs:  Basename 09/29/11 1401  WBC 11.6*  HGB 11.8*  PLT 356  LABCREA --  CREATININE --   Estimated Creatinine Clearance: 87.3 ml/min (by C-G formula based on Cr of 0.77). No results found for this basename: VANCOTROUGH:2,VANCOPEAK:2,VANCORANDOM:2,GENTTROUGH:2,GENTPEAK:2,GENTRANDOM:2,TOBRATROUGH:2,TOBRAPEAK:2,TOBRARND:2,AMIKACINPEAK:2,AMIKACINTROU:2,AMIKACIN:2, in the last 72 hours   Microbiology: Recent Results (from the past 720 hour(s))  SURGICAL PCR SCREEN     Status: Normal   Collection Time   09/29/11  1:50 PM      Component Value Range Status Comment   MRSA, PCR NEGATIVE  NEGATIVE  Final    Staphylococcus aureus NEGATIVE  NEGATIVE  Final     Medical History: Past Medical History  Diagnosis Date  . Breast abscess 06/2011    right breast   . Asthma     prn inhaler    Medications:  Scheduled:    . mupirocin ointment   Nasal BID  . naproxen  500 mg Oral BID WC  . piperacillin-tazobactam (ZOSYN)  IV  3.375 g Intravenous Q8H  . sodium chloride  3 mL Intravenous Q12H  . vancomycin  1,000 mg Intravenous Q12H  . DISCONTD: ampicillin-sulbactam (UNASYN) IV  3 g Intravenous 60 min Pre-Op  . DISCONTD:  ceFAZolin (ANCEF) IV  1 g Intravenous 60 min Pre-Op   Assessment: 21 yr old female post op debridement and drainage of breast abscess. To be on IV antibiotics overnight. Pt received a dose of Unasyn 3 Gm in the OR.  Goal of  Therapy:  Vancomycin trough level 10-15 mcg/ml  Plan:  Zosyn 3.375 Gm IV q8h and Vancomycin 1 Gm IV q12 hrs. Antibiotics to be given overnight post op breast abscess debridement.  Eugene Garnet 09/29/2011,7:56 PM

## 2011-09-29 NOTE — Anesthesia Preprocedure Evaluation (Signed)
Anesthesia Evaluation  Patient identified by MRN, date of birth, ID band Patient awake    Reviewed: Allergy & Precautions, H&P , NPO status , Patient's Chart, lab work & pertinent test results  Airway Mallampati: II  Neck ROM: full    Dental   Pulmonary asthma ,          Cardiovascular     Neuro/Psych    GI/Hepatic   Endo/Other    Renal/GU      Musculoskeletal   Abdominal   Peds  Hematology   Anesthesia Other Findings   Reproductive/Obstetrics                           Anesthesia Physical Anesthesia Plan  ASA: II  Anesthesia Plan: General   Post-op Pain Management:    Induction: Intravenous  Airway Management Planned: LMA  Additional Equipment:   Intra-op Plan:   Post-operative Plan:   Informed Consent: I have reviewed the patients History and Physical, chart, labs and discussed the procedure including the risks, benefits and alternatives for the proposed anesthesia with the patient or authorized representative who has indicated his/her understanding and acceptance.     Plan Discussed with: CRNA and Surgeon  Anesthesia Plan Comments:         Anesthesia Quick Evaluation  

## 2011-09-29 NOTE — Progress Notes (Signed)
UR complete 

## 2011-09-30 ENCOUNTER — Encounter (HOSPITAL_COMMUNITY): Payer: Self-pay | Admitting: Surgery

## 2011-09-30 ENCOUNTER — Other Ambulatory Visit (HOSPITAL_COMMUNITY): Payer: BC Managed Care – PPO

## 2011-09-30 MED ORDER — MORPHINE SULFATE 2 MG/ML IJ SOLN
1.0000 mg | INTRAMUSCULAR | Status: DC | PRN
  Administered 2011-09-30: 2 mg via INTRAVENOUS
  Filled 2011-09-30: qty 1

## 2011-09-30 NOTE — Progress Notes (Addendum)
Spoke to patient about HHRN need at d/c. Pt wishes to speak with her father about agency choice. Told her I would check in with her again tomorrow about her choice and make whatever arrangements they had picked. Confirmed that her address and phone in Epic are correct.   10/01/2011 1026am--Returned to patient's room to see if she had spoken to her father about [facility] agency choice like she had said she wanted to do.  She had and he had told her his first choice was [facility].  He was agreeable to using anyone else that is in network if [facility] was unable to provide the service. Called [facility] local office and spoke with Jonny Ruiz just now. He is verifying insurance and available staffing and will call me with decision.

## 2011-09-30 NOTE — Progress Notes (Signed)
Patient ID: Rachael Manning, female   DOB: 1991-01-19, 21 y.o.   MRN: [id number] 1 Day Post-Op  Subjective: Pt having a lot of pain.  Is terrified to do dressing changes.  She said the last 2 times, they pulled the packing out and never replaced it, so now that she has to have BID dressing changes, she's scared.  Objective: Vital signs in last 24 hours: Temp:  [97.8 F (36.6 C)-98.3 F (36.8 C)] 98 F (36.7 C) (04/25 0557) Pulse Rate:  [80-114] 96  (04/25 0557) Resp:  [12-27] 18  (04/25 0557) BP: (91-121)/(41-72) 107/67 mmHg (04/25 0557) SpO2:  [95 %-100 %] 97 % (04/25 0557) Weight:  [128 lb (58.06 kg)] 128 lb (58.06 kg) (04/24 1900)    Intake/Output from previous day: 04/24 0701 - 04/25 0700 In: 1402.5 [P.O.:240; I.V.:1162.5] Out: 400 [Urine:400] Intake/Output this shift:    PE: Breast: still with some erythema.  Wound pack and looks clean.  No drainage seen.  VERY tender.  Lab Results:   Basename 09/29/11 1401  WBC 11.6*  HGB 11.8*  HCT 36.3  PLT 356   BMET No results found for this basename: NA:2,K:2,CL:2,CO2:2,GLUCOSE:2,BUN:2,CREATININE:2,CALCIUM:2 in the last 72 hours PT/INR No results found for this basename: LABPROT:2,INR:2 in the last 72 hours CMP     Component Value Date/Time   NA 132* 02/26/2011 1757   K 4.9 02/26/2011 1757   CL 96 02/26/2011 1757   CO2 25 02/26/2011 1757   GLUCOSE 81 02/26/2011 1757   BUN 8 02/26/2011 1757   CREATININE 0.77 02/26/2011 1757   CALCIUM 9.8 02/26/2011 1757   GFRNONAA >60 02/26/2011 1757   GFRAA >60 02/26/2011 1757   Lipase  No results found for this basename: lipase       Studies/Results: No results found.  Anti-infectives: Anti-infectives     Start     Dose/Rate Route Frequency Ordered Stop   09/29/11 2200  piperacillin-tazobactam (ZOSYN) IVPB 3.375 g       3.375 g 12.5 mL/hr over 240 Minutes Intravenous 3 times per day 09/29/11 1952     09/29/11 2000   vancomycin (VANCOCIN) IVPB 1000 mg/200 mL premix        1,000 mg 200 mL/hr over 60 Minutes Intravenous Every 12 hours 09/29/11 1952     09/29/11 1338   Ampicillin-Sulbactam (UNASYN) 3 g in sodium chloride 0.9 % 100 mL IVPB  Status:  Discontinued        3 g 100 mL/hr over 60 Minutes Intravenous 60 min pre-op 09/29/11 1337 09/29/11 1914   09/29/11 1337   ceFAZolin (ANCEF) IVPB 1 g/50 mL premix  Status:  Discontinued        1 g 100 mL/hr over 30 Minutes Intravenous 60 min pre-op 09/29/11 1337 09/29/11 1344           Assessment/Plan  1. Recurrent right breast abscess, s/p I&D  Plan: 1. Will start dressing changes today.  If she tolerates then she can go home, if not then will keep her here for pain control.   LOS: 1 day    Peng Thorstenson E 09/30/2011

## 2011-09-30 NOTE — Progress Notes (Signed)
Pt needs packing done - daily probably OK Make sure she can tolerate this given the very tender/sensitive location - IV prob needed at first

## 2011-09-30 NOTE — Anesthesia Postprocedure Evaluation (Signed)
Anesthesia Post Note  Patient: Rachael Manning  Procedure(s) Performed: Procedure(s) (LRB): BREAST BIOPSY (Right)  Anesthesia type: General  Patient location: PACU  Post pain: Pain level controlled and Adequate analgesia  Post assessment: Post-op Vital signs reviewed, Patient's Cardiovascular Status Stable, Respiratory Function Stable, Patent Airway and Pain level controlled  Last Vitals:  Filed Vitals:   09/30/11 0557  BP: 107/67  Pulse: 96  Temp: 36.7 C  Resp: 18    Post vital signs: Reviewed and stable  Level of consciousness: awake, alert  and oriented  Complications: No apparent anesthesia complications

## 2011-10-01 LAB — BASIC METABOLIC PANEL
Calcium: 9.3 mg/dL (ref 8.4–10.5)
GFR calc non Af Amer: >90 mL/min (ref >90)
Glucose, Bld: 84 mg/dL (ref 70–99)
Sodium: 136 mEq/L (ref 135–145)

## 2011-10-01 MED ORDER — OXYCODONE HCL 5 MG PO TABS: 5.0000 mg | ORAL_TABLET | ORAL | Status: AC | PRN

## 2011-10-01 MED ORDER — SULFAMETHOXAZOLE-TRIMETHOPRIM 800-160 MG PO TABS: 1.0000 | ORAL_TABLET | Freq: Two times a day (BID) | ORAL | Status: AC

## 2011-10-01 NOTE — Discharge Summary (Signed)
  Patient ID: Rachael Manning MRN: [id number] DOB/AGE: [birth year] 20 y.o.  Admit date: 09/29/2011 Discharge date: 10/01/2011  Procedures: I&D of right breast abscess  Consults: None  Reason for Admission: this is a 21 yo female with a recurrent right breast abscess that required surgical drainage.  Admission Diagnoses:  1. Recurrent right breast abscess  Hospital Course: The patient was admitted and taken to the operating room where she underwent an incision and drainage.  She tolerated this well.  On POD#1, she was having issues with pain control.  Her breast still had some erythema.  She was continued on abx and dressing changes.  On POD#2, she had better control of her pain.  Her erythema was improved and her wound was clean.  She was stable for discharge home.  PE: Breast: decreased cellulitis.  Wound is clean with minimal drainage.  Discharge Diagnoses:  Principal Problem:  *Abscess of right breast Active Problems:  Asthma s/p I&D  Discharge Medications: Medication List  As of 10/01/2011 10:36 AM   STOP taking these medications         doxycycline 100 MG tablet         TAKE these medications         albuterol 108 (90 BASE) MCG/ACT inhaler   Commonly known as: PROVENTIL HFA;VENTOLIN HFA   Inhale 2 puffs into the lungs every 4 (four) hours as needed. For shortness of breath.      oxyCODONE 5 MG immediate release tablet   Commonly known as: Oxy IR/ROXICODONE   Take 1-2 tablets (5-10 mg total) by mouth every 4 (four) hours as needed.      sulfamethoxazole-trimethoprim 800-160 MG per tablet   Commonly known as: BACTRIM DS,SEPTRA DS   Take 1 tablet by mouth 2 (two) times daily.            Discharge Instructions: Follow-up Information    Follow up with [facility], [place]. Schedule an appointment as soon as possible for a visit in 1 week.   Contact information:   [employer] [address] [place]  [address] [phone]          Signed: Letha Cape 10/01/2011, 10:36 AM

## 2011-10-01 NOTE — Discharge Summary (Signed)
HH set up Pain control regimen to tolerate dressing changes which are essential to wound healing & preventing premature closure & reformation of the abscess

## 2011-10-04 LAB — ANAEROBIC CULTURE

## 2011-10-05 ENCOUNTER — Encounter (HOSPITAL_COMMUNITY): Admission: RE | Payer: Self-pay | Source: Ambulatory Visit

## 2011-10-05 ENCOUNTER — Ambulatory Visit (HOSPITAL_COMMUNITY): Admission: RE | Admit: 2011-10-05 | Payer: BC Managed Care – PPO | Source: Ambulatory Visit | Admitting: General Surgery

## 2011-10-05 SURGERY — BREAST BIOPSY
Anesthesia: General | Site: Breast | Laterality: Right

## 2011-10-22 ENCOUNTER — Encounter (HOSPITAL_COMMUNITY): Payer: Self-pay | Admitting: *Deleted

## 2011-10-22 ENCOUNTER — Emergency Department (HOSPITAL_COMMUNITY)
Admission: EM | Admit: 2011-10-22 | Discharge: 2011-10-22 | Disposition: A | Discharge status: Home / Self Care | Payer: BC Managed Care – PPO | Attending: Emergency Medicine | Admitting: Emergency Medicine

## 2011-10-22 ENCOUNTER — Telehealth (INDEPENDENT_AMBULATORY_CARE_PROVIDER_SITE_OTHER): Payer: Self-pay | Admitting: General Surgery

## 2011-10-22 DIAGNOSIS — N61 Mastitis without abscess: Secondary | ICD-10-CM | POA: Insufficient documentation

## 2011-10-22 DIAGNOSIS — N611 Abscess of the breast and nipple: Secondary | ICD-10-CM

## 2011-10-22 DIAGNOSIS — J45909 Unspecified asthma, uncomplicated: Secondary | ICD-10-CM | POA: Insufficient documentation

## 2011-10-22 MED ORDER — OXYCODONE-ACETAMINOPHEN 5-325 MG PO TABS: 1.0000 | ORAL_TABLET | Freq: Four times a day (QID) | ORAL | Status: AC | PRN

## 2011-10-22 MED ORDER — OXYCODONE-ACETAMINOPHEN 5-325 MG PO TABS
2.0000 | ORAL_TABLET | Freq: Once | ORAL | Status: AC
  Administered 2011-10-22: 2 via ORAL

## 2011-10-22 MED ORDER — CLINDAMYCIN HCL 150 MG PO CAPS: 450.0000 mg | ORAL_CAPSULE | Freq: Three times a day (TID) | ORAL | Status: AC

## 2011-10-22 MED ORDER — OXYCODONE-ACETAMINOPHEN 5-325 MG PO TABS
ORAL_TABLET | ORAL | Status: AC
  Filled 2011-10-22: qty 2

## 2011-10-22 MED ORDER — CLINDAMYCIN PHOSPHATE 600 MG/50ML IV SOLN
600.0000 mg | Freq: Once | INTRAVENOUS | Status: AC
  Administered 2011-10-22: 600 mg via INTRAVENOUS
  Filled 2011-10-22: qty 50

## 2011-10-22 NOTE — ED Notes (Signed)
Rx given x1 D/c instructions reviewed w/ pt - pt denies any further questions or concerns at present.  Pt ambulating independently w/ steady gait on d/c - A&Ox4 in no acute distress w/ friend at bedside to drive pt home.

## 2011-10-22 NOTE — ED Provider Notes (Signed)
History     CSN: [id number]  Arrival date & time 10/22/11  1622   First MD Initiated Contact with Patient 10/22/11 1846      Chief Complaint  Patient presents with  . Abscess    (Consider location/radiation/quality/duration/timing/severity/associated sxs/prior treatment) HPI Comments: Patient with recurrent abscesses of the right breast comes in today with an abscess of the breast, which she first noticed today.  She has had the area incised and drained twice by surgery in the past.  Last surgery was performed by Dr. Dwain Sarna 09/29/11.  She reports that after the surgery the abscess did resolve, but has now returned today.  After the surgery she was put on a course of Doxycycline,which she finished a  couple of weeks ago.The area is open and actively draining purulent fluid at this time.  She reports that the area is very tender to palpation.    The history is provided by the patient.    Past Medical History  Diagnosis Date  . Breast abscess 06/2011    right breast   . Asthma     prn inhaler    Past Surgical History  Procedure Date  . 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 Breast surgery 02/26/2011    I & D right breast abscess  . Breast biopsy 09/29/2011    Procedure: BREAST BIOPSY;  Surgeon: Ardeth Sportsman, MD;  Location: [facility] OR;  Service: General;  Laterality: Right;  I&D R breast    No family history on file.  History  Substance Use Topics  . Smoking status: Never Smoker   . Smokeless tobacco: Never Used  . Alcohol Use: Yes     occasionally    OB History    Grav Para Term Preterm Abortions TAB SAB Ect Mult Living                  Review of Systems  Constitutional: Negative for fever and chills.  Respiratory: Negative for shortness of breath.   Gastrointestinal: Negative for nausea, vomiting and abdominal pain.  Skin: Positive for color change.       abscess  Neurological: Negative for dizziness and light-headedness.    Allergies  Mushroom  extract complex  Home Medications   Current Outpatient Rx  Name Route Sig Dispense Refill  . ALBUTEROL SULFATE HFA 108 (90 BASE) MCG/ACT IN AERS Inhalation Inhale 2 puffs into the lungs every 4 (four) hours as needed. For shortness of breath.      BP 109/66  Pulse 99  Temp(Src) 98.3 F (36.8 C) (Oral)  Resp 14  SpO2 98%  LMP 09/20/2011  Physical Exam  Nursing note and vitals reviewed. Constitutional: She appears well-developed and well-nourished. No distress.  HENT:  Head: Normocephalic and atraumatic.  Mouth/Throat: Oropharynx is clear and moist.  Cardiovascular: Normal rate, regular rhythm and normal heart sounds.   Pulmonary/Chest: Effort normal and breath sounds normal.  Abdominal: Soft. Bowel sounds are normal.  Musculoskeletal: Normal range of motion.  Neurological: She is alert.  Skin: She is not diaphoretic.     Psychiatric: She has a normal mood and affect.    ED Course  Procedures (including critical care time)  Labs Reviewed - No data to display No results found.   No diagnosis found.    MDM  Patient with abscess of the right breast that had been incised and drained by surgery twice in the past.  Last surgery was 09/29/11.  Area open and actively draining  at this time.  Patient given IV antibiotics in the ED and discharged home with a course of oral antibiotics.  Patient also given short course of pain medication and instructed to apply warm compresses to the area.  Patient given referral to Surgery if needed.        Pascal Lux [place], PA-C 10/23/11 1341

## 2011-10-22 NOTE — ED Notes (Signed)
Reports surgery on 4/24 to remove abscess from right breast, today wound reopened and purulent drainage draining. Rt breast with redness and tender to touch.  Pain 8/10. Dr. Dwain Sarna surgeon

## 2011-10-22 NOTE — Telephone Encounter (Signed)
Pt calling about Rt. Breast abscess which has previously been I&D'd, most recently on 09/29/11.  It is again leaking thick yellow pus and is quite tender to the touch.  Pt is unsure if she has a fever, but denies overall achyness.  She is not on any antibiotics or pain meds at this time.  After conferring with Dr. Corliss Skains, pt advised to go to [facility] ER to seek treatment. She understands and says she will go.

## 2011-10-23 NOTE — ED Provider Notes (Signed)
Medical screening examination/treatment/procedure(s) were performed by non-physician practitioner and as supervising physician I was immediately available for consultation/collaboration.   Gerhard Munch, MD 10/23/11 [phone]

## 2011-11-10 ENCOUNTER — Ambulatory Visit (HOSPITAL_COMMUNITY)
Admission: AD | Admit: 2011-11-10 | Discharge: 2011-11-12 | Disposition: A | Discharge status: Home / Self Care | Payer: BC Managed Care – PPO | Source: Ambulatory Visit | Attending: General Surgery | Admitting: General Surgery

## 2011-11-10 ENCOUNTER — Ambulatory Visit (INDEPENDENT_AMBULATORY_CARE_PROVIDER_SITE_OTHER): Payer: BC Managed Care – PPO | Admitting: Surgery

## 2011-11-10 ENCOUNTER — Encounter (INDEPENDENT_AMBULATORY_CARE_PROVIDER_SITE_OTHER): Payer: Self-pay | Admitting: Surgery

## 2011-11-10 ENCOUNTER — Encounter (HOSPITAL_COMMUNITY): Payer: Self-pay | Admitting: General Practice

## 2011-11-10 VITALS — BP 110/60 | HR 108 | Temp 97.8°F | Ht 62.0 in | Wt 125.4 lb

## 2011-11-10 DIAGNOSIS — N61 Mastitis without abscess: Secondary | ICD-10-CM

## 2011-11-10 DIAGNOSIS — N611 Abscess of the breast and nipple: Secondary | ICD-10-CM

## 2011-11-10 HISTORY — DX: Personal history of other diseases of the respiratory system: Z87.09

## 2011-11-10 LAB — CBC
HCT: 36.8 % (ref 36.0–46.0)
MCH: 26.2 pg (ref 26.0–34.0)
MCHC: 32.6 g/dL (ref 30.0–36.0)
MCV: 80.3 fL (ref 78.0–100.0)
RDW: 14.6 % (ref 11.5–15.5)

## 2011-11-10 LAB — BASIC METABOLIC PANEL
BUN: 9 mg/dL (ref 6–23)
Chloride: 104 mEq/L (ref 96–112)
Creatinine, Ser: 0.84 mg/dL (ref 0.50–1.10)
GFR calc Af Amer: >90 mL/min (ref >90)
Glucose, Bld: 80 mg/dL (ref 70–99)

## 2011-11-10 MED ORDER — CEFAZOLIN SODIUM 1-5 GM-% IV SOLN
1.0000 g | Freq: Three times a day (TID) | INTRAVENOUS | Status: DC
  Administered 2011-11-10 – 2011-11-11 (×5): 1 g via INTRAVENOUS
  Filled 2011-11-10 (×7): qty 50

## 2011-11-10 MED ORDER — POTASSIUM CHLORIDE IN NACL 20-0.9 MEQ/L-% IV SOLN
INTRAVENOUS | Status: DC
  Administered 2011-11-10 – 2011-11-11 (×2): via INTRAVENOUS
  Filled 2011-11-10 (×6): qty 1000

## 2011-11-10 MED ORDER — MORPHINE SULFATE 2 MG/ML IJ SOLN
2.0000 mg | INTRAMUSCULAR | Status: DC | PRN
  Administered 2011-11-10 – 2011-11-11 (×6): 2 mg via INTRAVENOUS
  Filled 2011-11-10 (×7): qty 1

## 2011-11-10 MED ORDER — ONDANSETRON HCL 4 MG/2ML IJ SOLN: 4.0000 mg | Freq: Four times a day (QID) | INTRAMUSCULAR | Status: DC | PRN

## 2011-11-10 NOTE — Progress Notes (Signed)
Patient ID: Rachael Manning, female   DOB: Apr 30, 1991, 21 y.o.   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  I have told her in past that I thought after drainage she needed elective subareolar duct excision but she has not followed up until she had recurrent abscess.  I saw her tonight with same and recommended going to OR for incision and drainage.  She refuses tonight saying she is not ready and will do it tomorrow.

## 2011-11-10 NOTE — Progress Notes (Signed)
Patient ID: Rachael Manning, female   DOB: [birth year], 21 y.o.   MRN: [id number]  Chief Complaint  Patient presents with  . Other    Rt br abscess    HPI Rachael Manning is a 21 y.o. female.  This is a female with a recurring right breast abscess who is having multiple incision and drainages under general anesthesia. Her last surgery was in April of this year. She was back on antibiotics in may for 10 days. It has now recurred again.  HPI  Past Medical History  Diagnosis Date  . Breast abscess 06/2011    right breast   . Asthma     prn inhaler    Past Surgical History  Procedure Date  . 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 Breast surgery 02/26/2011    I & D right breast abscess  . Breast biopsy 09/29/2011    Procedure: BREAST BIOPSY;  Surgeon: Ardeth Sportsman, MD;  Location: [facility] OR;  Service: General;  Laterality: Right;  I&D R breast    History reviewed. No pertinent family history.  Social History History  Substance Use Topics  . Smoking status: Never Smoker   . Smokeless tobacco: Never Used  . Alcohol Use: Yes     occasionally    Allergies  Allergen Reactions  . Mushroom Extract Complex Swelling    SWELLING OF LIPS, THROAT CLOSES    Current Outpatient Prescriptions  Medication Sig Dispense Refill  . albuterol (PROVENTIL HFA;VENTOLIN HFA) 108 (90 BASE) MCG/ACT inhaler Inhale 2 puffs into the lungs every 4 (four) hours as needed. For shortness of breath.        Review of Systems Review of Systems  Constitutional: Negative for fever, chills and unexpected weight change.  HENT: Negative for hearing loss, congestion, sore throat, trouble swallowing and voice change.   Eyes: Negative for visual disturbance.  Respiratory: Negative for cough and wheezing.   Cardiovascular: Negative for chest pain, palpitations and leg swelling.  Gastrointestinal: Negative for nausea, vomiting, abdominal pain, diarrhea, constipation, blood in stool, abdominal  distention and anal bleeding.  Genitourinary: Negative for hematuria, vaginal bleeding and difficulty urinating.  Musculoskeletal: Negative for arthralgias.  Skin: Negative for rash and wound.  Neurological: Negative for seizures, syncope and headaches.  Hematological: Negative for adenopathy. Does not bruise/bleed easily.  Psychiatric/Behavioral: Negative for confusion.    Blood pressure 110/60, pulse 108, temperature 97.8 F (36.6 C), temperature source Temporal, height 5\' 2"  (1.575 m), weight 125 lb 6.4 oz (56.881 kg), SpO2 99.00%.  Physical Exam Physical Exam  Constitutional: She appears well-nourished. No distress.  There is an obvious abscess on the right breast on the medial aspect of the areola. There is fluctuance and erythema  Data Reviewed   Assessment    Recurrent right breast abscess    Plan    She'll be admitted to the hospital for planned incision and drainage in the operating room. I will start her on antibiotics and I have notified my partners on call.       Laurann Mcmorris A 11/10/2011, 4:08 PM

## 2011-11-11 ENCOUNTER — Encounter (HOSPITAL_COMMUNITY): Admission: AD | Disposition: A | Discharge status: Home / Self Care | Payer: Self-pay | Source: Ambulatory Visit

## 2011-11-11 ENCOUNTER — Encounter (HOSPITAL_COMMUNITY): Payer: Self-pay | Admitting: *Deleted

## 2011-11-11 ENCOUNTER — Observation Stay (HOSPITAL_COMMUNITY): Payer: BC Managed Care – PPO | Admitting: *Deleted

## 2011-11-11 HISTORY — PX: IRRIGATION AND DEBRIDEMENT ABSCESS: SHX5252

## 2011-11-11 LAB — GRAM STAIN

## 2011-11-11 SURGERY — IRRIGATION AND DEBRIDEMENT ABSCESS
Anesthesia: General | Site: Breast | Laterality: Right | Wound class: Contaminated

## 2011-11-11 MED ORDER — MIDAZOLAM HCL 5 MG/5ML IJ SOLN
INTRAMUSCULAR | Status: DC | PRN
  Administered 2011-11-11 (×2): 1 mg via INTRAVENOUS

## 2011-11-11 MED ORDER — ALBUTEROL SULFATE HFA 108 (90 BASE) MCG/ACT IN AERS
2.0000 | INHALATION_SPRAY | RESPIRATORY_TRACT | Status: DC
  Filled 2011-11-11: qty 6.7

## 2011-11-11 MED ORDER — HYDROCODONE-ACETAMINOPHEN 5-325 MG PO TABS
1.0000 | ORAL_TABLET | ORAL | Status: DC | PRN
  Administered 2011-11-12 (×3): 2 via ORAL
  Filled 2011-11-11 (×3): qty 2

## 2011-11-11 MED ORDER — MORPHINE SULFATE 2 MG/ML IJ SOLN
1.0000 mg | INTRAMUSCULAR | Status: DC | PRN
  Administered 2011-11-11: 1 mg via INTRAVENOUS
  Filled 2011-11-11: qty 1

## 2011-11-11 MED ORDER — CHLORHEXIDINE GLUCONATE 4 % EX LIQD
1.0000 "application " | Freq: Once | CUTANEOUS | Status: DC
  Filled 2011-11-11: qty 15

## 2011-11-11 MED ORDER — HYDROMORPHONE HCL PF 1 MG/ML IJ SOLN
0.2500 mg | INTRAMUSCULAR | Status: DC | PRN
  Administered 2011-11-11: 0.5 mg via INTRAVENOUS

## 2011-11-11 MED ORDER — POTASSIUM CHLORIDE IN NACL 20-0.9 MEQ/L-% IV SOLN
INTRAVENOUS | Status: DC
  Administered 2011-11-12: via INTRAVENOUS
  Filled 2011-11-11 (×2): qty 1000

## 2011-11-11 MED ORDER — ONDANSETRON HCL 4 MG PO TABS: 4.0000 mg | ORAL_TABLET | Freq: Four times a day (QID) | ORAL | Status: DC | PRN

## 2011-11-11 MED ORDER — ONDANSETRON HCL 4 MG/2ML IJ SOLN: 4.0000 mg | Freq: Four times a day (QID) | INTRAMUSCULAR | Status: DC | PRN

## 2011-11-11 MED ORDER — ONDANSETRON HCL 4 MG/2ML IJ SOLN
INTRAMUSCULAR | Status: DC | PRN
  Administered 2011-11-11: 4 mg via INTRAVENOUS

## 2011-11-11 MED ORDER — LACTATED RINGERS IV SOLN
INTRAVENOUS | Status: DC | PRN
  Administered 2011-11-11: 09:00:00 via INTRAVENOUS

## 2011-11-11 MED ORDER — FENTANYL CITRATE 0.05 MG/ML IJ SOLN
INTRAMUSCULAR | Status: DC | PRN
  Administered 2011-11-11: 100 ug via INTRAVENOUS

## 2011-11-11 MED ORDER — ACETAMINOPHEN 10 MG/ML IV SOLN
INTRAVENOUS | Status: DC | PRN
  Administered 2011-11-11: 1000 mg via INTRAVENOUS

## 2011-11-11 MED ORDER — PROPOFOL 10 MG/ML IV EMUL
INTRAVENOUS | Status: DC | PRN
  Administered 2011-11-11: 180 mg via INTRAVENOUS

## 2011-11-11 MED ORDER — CEFAZOLIN SODIUM 1-5 GM-% IV SOLN
1.0000 g | Freq: Three times a day (TID) | INTRAVENOUS | Status: DC
  Administered 2011-11-12: 1 g via INTRAVENOUS
  Filled 2011-11-11 (×3): qty 50

## 2011-11-11 MED ORDER — HEPARIN SODIUM (PORCINE) 5000 UNIT/ML IJ SOLN
5000.0000 [IU] | Freq: Three times a day (TID) | INTRAMUSCULAR | Status: DC
  Filled 2011-11-11 (×4): qty 1

## 2011-11-11 MED ORDER — DROPERIDOL 2.5 MG/ML IJ SOLN
0.6250 mg | INTRAMUSCULAR | Status: DC | PRN
  Filled 2011-11-11: qty 0.25

## 2011-11-11 MED ORDER — ACETAMINOPHEN 10 MG/ML IV SOLN
INTRAVENOUS | Status: AC
  Filled 2011-11-11: qty 100

## 2011-11-11 MED ORDER — LACTATED RINGERS IV SOLN
INTRAVENOUS | Status: DC
  Administered 2011-11-11: 09:00:00 via INTRAVENOUS

## 2011-11-11 SURGICAL SUPPLY — 35 items
BLADE SURG 10 STRL SS (BLADE) ×2 IMPLANT
BLADE SURG ROTATE 9660 (MISCELLANEOUS) IMPLANT
CANISTER SUCTION 2500CC (MISCELLANEOUS) ×2 IMPLANT
CHLORAPREP W/TINT 26ML (MISCELLANEOUS) IMPLANT
CLOTH BEACON ORANGE TIMEOUT ST (SAFETY) ×2 IMPLANT
COVER SURGICAL LIGHT HANDLE (MISCELLANEOUS) ×2 IMPLANT
DRAPE LAPAROTOMY TRNSV 102X78 (DRAPE) ×2 IMPLANT
DRAPE UTILITY 15X26 W/TAPE STR (DRAPE) IMPLANT
ELECT CAUTERY BLADE 6.4 (BLADE) ×2 IMPLANT
ELECT REM PT RETURN 9FT ADLT (ELECTROSURGICAL) ×2
ELECTRODE REM PT RTRN 9FT ADLT (ELECTROSURGICAL) ×1 IMPLANT
GAUZE PACKING IODOFORM 1/4X5 (PACKING) ×2 IMPLANT
GLOVE BIOGEL PI IND STRL 7.0 (GLOVE) ×1 IMPLANT
GLOVE BIOGEL PI INDICATOR 7.0 (GLOVE) ×1
GLOVE EUDERMIC 7 POWDERFREE (GLOVE) ×2 IMPLANT
GLOVE SURG SS PI 7.0 STRL IVOR (GLOVE) ×2 IMPLANT
GOWN PREVENTION PLUS XLARGE (GOWN DISPOSABLE) ×2 IMPLANT
GOWN STRL NON-REIN LRG LVL3 (GOWN DISPOSABLE) ×2 IMPLANT
KIT BASIN OR (CUSTOM PROCEDURE TRAY) ×2 IMPLANT
KIT ROOM TURNOVER OR (KITS) ×2 IMPLANT
NS IRRIG 1000ML POUR BTL (IV SOLUTION) ×2 IMPLANT
PACK SURGICAL SETUP 50X90 (CUSTOM PROCEDURE TRAY) ×2 IMPLANT
PAD ARMBOARD 7.5X6 YLW CONV (MISCELLANEOUS) ×2 IMPLANT
PENCIL BUTTON HOLSTER BLD 10FT (ELECTRODE) ×2 IMPLANT
SPONGE GAUZE 4X4 12PLY (GAUZE/BANDAGES/DRESSINGS) ×2 IMPLANT
SPONGE LAP 18X18 X RAY DECT (DISPOSABLE) ×2 IMPLANT
SWAB COLLECTION DEVICE MRSA (MISCELLANEOUS) ×2 IMPLANT
SYR BULB IRRIGATION 50ML (SYRINGE) ×2 IMPLANT
TAPE CLOTH SURG 4X10 WHT LF (GAUZE/BANDAGES/DRESSINGS) ×2 IMPLANT
TOWEL OR 17X24 6PK STRL BLUE (TOWEL DISPOSABLE) ×2 IMPLANT
TOWEL OR 17X26 10 PK STRL BLUE (TOWEL DISPOSABLE) ×2 IMPLANT
TUBE ANAEROBIC SPECIMEN COL (MISCELLANEOUS) ×2 IMPLANT
TUBE CONNECTING 12X1/4 (SUCTIONS) ×2 IMPLANT
WATER STERILE IRR 1000ML POUR (IV SOLUTION) IMPLANT
YANKAUER SUCT BULB TIP NO VENT (SUCTIONS) ×2 IMPLANT

## 2011-11-11 NOTE — Anesthesia Preprocedure Evaluation (Addendum)
Anesthesia Evaluation  Patient identified by MRN, date of birth, ID band Patient awake    Reviewed: Allergy & Precautions, H&P , NPO status   Airway Mallampati: II TM Distance: >3 FB Neck ROM: Full    Dental  (+) Dental Advisory Given and Teeth Intact   Pulmonary asthma ,  breath sounds clear to auscultation  Pulmonary exam normal       Cardiovascular negative cardio ROS  Rhythm:Regular Rate:Normal     Neuro/Psych negative neurological ROS     GI/Hepatic negative GI ROS, Neg liver ROS,   Endo/Other  negative endocrine ROS  Renal/GU negative Renal ROS     Musculoskeletal negative musculoskeletal ROS (+)   Abdominal Normal abdominal exam  (+)   Peds  Hematology negative hematology ROS (+)   Anesthesia Other Findings   Reproductive/Obstetrics negative OB ROS                          Anesthesia Physical Anesthesia Plan  ASA: II  Anesthesia Plan: General   Post-op Pain Management:    Induction: Intravenous  Airway Management Planned: LMA  Additional Equipment:   Intra-op Plan:   Post-operative Plan: Extubation in OR  Informed Consent: I have reviewed the patients History and Physical, chart, labs and discussed the procedure including the risks, benefits and alternatives for the proposed anesthesia with the patient or authorized representative who has indicated his/her understanding and acceptance.   Dental advisory given  Plan Discussed with: Surgeon, Anesthesiologist and CRNA  Anesthesia Plan Comments:        Anesthesia Quick Evaluation

## 2011-11-11 NOTE — Progress Notes (Signed)
UR complete 

## 2011-11-11 NOTE — Progress Notes (Signed)
Day of Surgery  Subjective: This patient was admitted last month with her third or fourth episode of right breast abscess. She has always been managed by Dr. Dwain Sarna in the past. She declined to have incision and drainage last night But is willing to have it done today.  All of her abscesses had been at the periareolar margin.  Objective: Vital signs in last 24 hours: Temp:  [97.6 F (36.4 C)-98.2 F (36.8 C)] 97.6 F (36.4 C) (06/06 0140) Pulse Rate:  [78-108] 78  (06/06 0140) Resp:  [16-18] 16  (06/06 0140) BP: (100-110)/(60-65) 100/60 mmHg (06/06 0140) SpO2:  [97 %-100 %] 100 % (06/06 0140) Weight:  [125 lb 6.4 oz (56.881 kg)] 125 lb 6.4 oz (56.881 kg) (06/05 1555) Last BM Date: 11/10/11  Intake/Output from previous day:   Intake/Output this shift:    General appearance: alert Breasts:  right breast reveals 2 cm abscess at the areolar margin. Evidence of old scar. Not actively draining. Nipple shows suggestion of central cleft.  No drainage from the nipple. Saline this is very localized.  Lab Results:   Basename 11/10/11 1701  WBC 11.5*  HGB 12.0  HCT 36.8  PLT 337   BMET  Basename 11/10/11 1701  NA 138  K 4.1  CL 104  CO2 25  GLUCOSE 80  BUN 9  CREATININE 0.84  CALCIUM 9.7   PT/INR No results found for this basename: LABPROT:2,INR:2 in the last 72 hours ABG No results found for this basename: PHART:2,PCO2:2,PO2:2,HCO3:2 in the last 72 hours  Studies/Results: No results found.  Anti-infectives: Anti-infectives     Start     Dose/Rate Route Frequency Ordered Stop   11/10/11 1745   ceFAZolin (ANCEF) IVPB 1 g/50 mL premix        1 g 100 mL/hr over 30 Minutes Intravenous 3 times per day 11/10/11 1657            Assessment/Plan: s/p Procedure(s): IRRIGATION AND DEBRIDEMENT ABSCESS  Recurrent right breast abscess, and areolar margin. Plan incision and drainage and OR today.  I have discussed the indications, details, techniques, and numerous  risks of this operation with the patient. She understands these issues, her questions are answered, she agrees with this plan.  Ultimately, she will probably need elective central duct excision at a later date.   LOS: 1 day    Lauris Serviss M. Derrell Lolling, M.D., [facility] General and Minimally invasive Surgery Breast and Colorectal Surgery Office:   [phone] Pager:   [phone]  11/11/2011

## 2011-11-11 NOTE — Progress Notes (Signed)
Patient left unit for OR with consent, pre-op done

## 2011-11-11 NOTE — Preoperative (Signed)
Beta Blockers   Reason not to administer Beta Blockers:Not Applicable 

## 2011-11-11 NOTE — Op Note (Signed)
Patient Name:           Rachael Manning   Date of Surgery:        11/11/2011  Pre op Diagnosis:      Recurrent periareolar abscess right breast, 3:00 position  Post op Diagnosis:    same  Procedure:                 Incision and drainage right breast abscess  Surgeon:                     Angelia Mould. Derrell Lolling, M.D., FACS  Assistant:                      none  Operative Indications:   This is a 21 year old [place] American female who has a prior history of right breast abscess. She states that she has had 3 abscesses in the past treated by Dr. Dwain Sarna. It has been recommended that she have elective ductal excision but she has never followed up except during an acute event. She was admitted by Dr. Dwain Sarna  last night with another periareolar abscess in the right breast at the 3:00 position. She declined to have this drained last night and so is brought to the operating room today for that surgery.  Operative Findings:       The right breast revealed a localized abscess at the areolar margin at the 3:00 position. I could not cannulate the involved duct. The nipple showed no evidence of infection or drainage but there was a transverse cleft.  Procedure in Detail:          Following the induction of general endotracheal anesthesia a surgical timeout performed, the right breast prepped and draped in a sterile fashion. The small abscess at 3:00 position was observed and then a circumareolar incision was made through the previous scar draining the abscess. This was not a very large abscess. Cultures were taken. I was able to probe with a hemostat down posteriorly and a little bit medially but there were no other loculations that could be broken up. This was irrigated out and then a 1/4 inch iodoform wick was placed. There was no bleeding. Dry gauze bandages were placed. The patient tolerated the procedure well and was taken recovery in stable condition. EBL 10 cc. Complications none. Counts  correct.     Angelia Mould. Derrell Lolling, M.D., FACS General and Minimally Invasive Surgery Breast and Colorectal Surgery  11/11/2011 9:27 AM

## 2011-11-11 NOTE — Transfer of Care (Signed)
Immediate Anesthesia Transfer of Care Note  Patient: Rachael Manning  Procedure(s) Performed: Procedure(s) (LRB): IRRIGATION AND DEBRIDEMENT ABSCESS (Right)  Patient Location: PACU  Anesthesia Type: General  Level of Consciousness: awake, oriented and patient cooperative  Airway & Oxygen Therapy: Patient Spontanous Breathing and Patient connected to nasal cannula oxygen  Post-op Assessment: Report given to PACU RN and Post -op Vital signs reviewed and stable  Post vital signs: Reviewed and stable  Complications: No apparent anesthesia complications

## 2011-11-11 NOTE — Anesthesia Postprocedure Evaluation (Signed)
Anesthesia Post Note  Patient: Rachael Manning  Procedure(s) Performed: Procedure(s) (LRB): IRRIGATION AND DEBRIDEMENT ABSCESS (Right)  Anesthesia type: general  Patient location: PACU  Post pain: Pain level controlled  Post assessment: Patient's Cardiovascular Status Stable  Last Vitals:  Filed Vitals:   11/11/11 1000  BP:   Pulse: 66  Temp:   Resp: 20    Post vital signs: Reviewed and stable  Level of consciousness: sedated  Complications: No apparent anesthesia complications

## 2011-11-12 ENCOUNTER — Encounter (INDEPENDENT_AMBULATORY_CARE_PROVIDER_SITE_OTHER): Payer: BC Managed Care – PPO | Admitting: General Surgery

## 2011-11-12 ENCOUNTER — Encounter (HOSPITAL_COMMUNITY): Payer: Self-pay | Admitting: General Surgery

## 2011-11-12 MED ORDER — ALBUTEROL SULFATE HFA 108 (90 BASE) MCG/ACT IN AERS: 2.0000 | INHALATION_SPRAY | RESPIRATORY_TRACT | Status: DC | PRN

## 2011-11-12 MED ORDER — DOXYCYCLINE HYCLATE 50 MG PO CAPS: 100.0000 mg | ORAL_CAPSULE | Freq: Two times a day (BID) | ORAL | Status: AC

## 2011-11-12 NOTE — Discharge Summary (Signed)
Physician Discharge Summary  Patient ID: Rachael Manning MRN: [id number] DOB/AGE: Oct 28, 1990 20 y.o.  Admit date: 11/10/2011 Discharge date: 11/12/2011  Admission Diagnoses:Recurrent periareolar abscess right breast, 3:00 position      Discharge Diagnoses: S/P I&D of periareolar abcess right breast 3:00 position Active Problems:  * No active hospital problems. *    Discharged Condition: stable  Hospital CourseThis is a 21 year old African American female who has a prior history of right breast abscess. She has had 3 abscesses in the past treated by Dr. Dwain Sarna. It has been recommended previously that she have elective ductal excision but she has elected to not undergo this procedure. And has instead elected to follow up only during an acute events. She was admitted by Dr. Dwain Sarna with admiting diagnosis of periareolar abscess in the right breast at the 3:00 position. She declined to have this drained urgently,so she underwent the procedure in the operating room the following morning of admission. Cultures were taken and she was treated pre and postoperatively with appropriate antibiotic therapy. She has remained afebrile and hemodynamically stable postoperatively, and is now stable for discharge to home.   Consults: None  Significant Diagnostic Studies: labs,microbiology.  Treatments: antibiotics, and surgery.  Discharge Exam: Blood pressure 104/80, pulse 72, temperature 98.5 F (36.9 C), temperature source Oral, resp. rate 16, last menstrual period 11/10/2011, SpO2 100.00%. Incision/Wound:wound edges are clean, red,granulating, minimal clear/bloody exudate is seen with initial removal of iodoform gauze. The breast is soft, tender only around surgical wound site. Disposition: 01-Home or Self Care   Medication List  As of 11/12/2011  8:53 AM   ASK your doctor about these medications         albuterol 108 (90 BASE) MCG/ACT inhaler   Commonly known as: PROVENTIL HFA;VENTOLIN HFA    Inhale 2 puffs into the lungs every 4 (four) hours as needed. For shortness of breath.             SignedBlenda Mounts 11/12/2011, 8:53 AM  Agree with dictate discharge summary. Patient interviewed and examined today.   Angelia Mould. Derrell Lolling, M.D., [facility] General and Minimally invasive Surgery Breast and Colorectal Surgery Office:   [phone] Pager:   [phone]

## 2011-11-12 NOTE — Progress Notes (Signed)
1 Day Post-Op  Subjective: Feels good this morning, breast is tender at surgical incision site. No other complaints offered.  Objective: Vital signs in last 24 hours: Temp:  [97.8 F (36.6 C)-98.8 F (37.1 C)] 98.5 F (36.9 C) (06/07 0602) Pulse Rate:  [57-87] 72  (06/07 0602) Resp:  [12-25] 16  (06/07 0602) BP: (89-113)/(44-96) 104/80 mmHg (06/07 0606) SpO2:  [95 %-100 %] 100 % (06/07 0602) Last BM Date: 11/10/11  Intake/Output from previous day: 06/06 0701 - 06/07 0700 In: 1485 [P.O.:480; I.V.:955; IV Piggyback:50] Out: 400 [Urine:400] Intake/Output this shift:    Incision/Wound:Wound is without erythema, edges are clean, granulating, red,minimal amount of thin clear/ bloody drainage seen after removal of iodoform wick. Breast is otherwise soft, tender only around surgical excision site.  Lab Results:   Basename 11/10/11 1701  WBC 11.5*  HGB 12.0  HCT 36.8  PLT 337   BMET  Basename 11/10/11 1701  NA 138  K 4.1  CL 104  CO2 25  GLUCOSE 80  BUN 9  CREATININE 0.84  CALCIUM 9.7   PT/INR No results found for this basename: LABPROT:2,INR:2 in the last 72 hours ABG No results found for this basename: PHART:2,PCO2:2,PO2:2,HCO3:2 in the last 72 hours  Studies/Results: No results found.  Anti-infectives: Anti-infectives     Start     Dose/Rate Route Frequency Ordered Stop   11/11/11 2359   ceFAZolin (ANCEF) IVPB 1 g/50 mL premix        1 g 100 mL/hr over 30 Minutes Intravenous Every 8 hours 11/11/11 2323     11/10/11 1745   ceFAZolin (ANCEF) IVPB 1 g/50 mL premix  Status:  Discontinued        1 g 100 mL/hr over 30 Minutes Intravenous 3 times per day 11/10/11 1657 11/11/11 2323          Assessment/Plan: s/p Procedure(s) (LRB): IRRIGATION AND DEBRIDEMENT ABSCESS (Right) Plan: 1. Removal of iodoform wick, application of dry dressing BID/prn. 2. Patient may shower 3. Doxycycline p.o.  BID X 10 days. 4.F/U with Dr. Dwain Sarna in 2 weeks time at our  office.  LOS: 2 days    DUANNE, JEREMY 11/12/2011  Agree with assessement and plans as above. Discussed issues with patient.  Angelia Mould. Derrell Lolling, M.D., [facility] General and Minimally invasive Surgery Breast and Colorectal Surgery Office:   [phone] Pager:   [phone]

## 2011-11-12 NOTE — Progress Notes (Signed)
DC home with family. No verbal questions about dsg changes. Verbally understood DC instructions.

## 2011-11-12 NOTE — Discharge Instructions (Signed)
Incision and Drainage of Abscess An abscess (boil or furuncle) is an area infected by germs that contains a collection of pus. Signs and problems (symptoms) of an abscess include pain, tenderness, redness, or hardness. You may feel a moveable, soft area under your skin. An abscess can occur anywhere in the body. Occasionally, this may spread to surrounding tissues causing cellulitis. Sometimes, a surgeon may make a cut (incision) over your abscess. The pus is drained. Gauze may be packed into the space to provide a drain. Keeping a drain or piece of gauze in the incision keeps the skin from healing first. This helps stop the abscess from forming again. The area may be painful for 5 to 7 days. Most people with an abscess do not have high fevers. If seen early, your abscess may not have localized and may not be cut. If it does not get better on its own or with medicines, you may require another appointment. HOME CARE INSTRUCTIONS   Only take over-the-counter or prescription medicines for pain, discomfort, or fever as directed by your caregiver. Use these only if your caregiver has not given medicines that would interfere.   When you bathe, remove the gauze drain after soaking. You may then wash the wound gently with mild, soapy water. Repack with gauze as your caregiver directs.   See your caregiver as directed for a recheck.   If antibiotics were prescribed, take them as directed.  SEEK MEDICAL CARE IF:   You develop increased pain, swelling, redness, drainage, or bleeding in the wound site.   You develop signs of generalized infection, including muscle aches, chills, or a general ill feeling.   You or your child has an oral temperature above 102 F (38.9 C).  MAKE SURE YOU:   Understand these instructions.   Will watch your condition.   Will get help right away if you are not doing well or get worse.  Document Released: 11/17/2000 Document Revised: 02/03/2011 Document Reviewed:  01/12/2008 Exit[employer] Patient Information 2012 [employer]. 

## 2011-11-14 LAB — CULTURE, ROUTINE-ABSCESS

## 2011-11-15 ENCOUNTER — Telehealth (INDEPENDENT_AMBULATORY_CARE_PROVIDER_SITE_OTHER): Payer: Self-pay

## 2011-11-15 ENCOUNTER — Telehealth (INDEPENDENT_AMBULATORY_CARE_PROVIDER_SITE_OTHER): Payer: Self-pay | Admitting: General Surgery

## 2011-11-15 NOTE — Telephone Encounter (Signed)
The patient is requesting pain medication states she received none when she left the hospital.

## 2011-11-15 NOTE — Telephone Encounter (Signed)
Pt notified vicodin 5/325 #20 called to [facility] [phone] per Dr Jacinto Halim order.

## 2011-11-18 LAB — ANAEROBIC CULTURE

## 2011-11-26 ENCOUNTER — Telehealth (INDEPENDENT_AMBULATORY_CARE_PROVIDER_SITE_OTHER): Payer: Self-pay | Admitting: General Surgery

## 2011-11-26 NOTE — Telephone Encounter (Signed)
Pt calling to request refill on Vicodin.  Please call to [facility]:  [phone].  She has chronic breast abscesses and had recent I&D.

## 2011-11-26 NOTE — Telephone Encounter (Signed)
Called pt to let her know Dr. Janee Morn had okayed her refill and [facility] had just been [facility] the [facility], as requested.

## 2011-11-30 ENCOUNTER — Ambulatory Visit (INDEPENDENT_AMBULATORY_CARE_PROVIDER_SITE_OTHER): Payer: BC Managed Care – PPO | Admitting: General Surgery

## 2011-11-30 ENCOUNTER — Encounter (INDEPENDENT_AMBULATORY_CARE_PROVIDER_SITE_OTHER): Payer: Self-pay | Admitting: General Surgery

## 2011-11-30 VITALS — BP 108/60 | HR 96 | Temp 98.1°F | Ht 62.0 in | Wt 124.4 lb

## 2011-11-30 DIAGNOSIS — N611 Abscess of the breast and nipple: Secondary | ICD-10-CM

## 2011-11-30 DIAGNOSIS — N61 Mastitis without abscess: Secondary | ICD-10-CM
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
## 2011-12-01 ENCOUNTER — Encounter (HOSPITAL_BASED_OUTPATIENT_CLINIC_OR_DEPARTMENT_OTHER): Payer: Self-pay | Admitting: *Deleted

## 2011-12-01 NOTE — Pre-Procedure Instructions (Signed)
Pt states she is not sure, she might be pregnant, LMP 11-10-11. Pt will come in for urine preg test

## 2011-12-03 ENCOUNTER — Encounter (HOSPITAL_BASED_OUTPATIENT_CLINIC_OR_DEPARTMENT_OTHER)
Admission: RE | Admit: 2011-12-03 | Discharge: 2011-12-03 | Disposition: A | Discharge status: Home / Self Care | Payer: BC Managed Care – PPO | Source: Ambulatory Visit | Attending: General Surgery | Admitting: General Surgery

## 2011-12-06 ENCOUNTER — Ambulatory Visit (HOSPITAL_BASED_OUTPATIENT_CLINIC_OR_DEPARTMENT_OTHER): Payer: BC Managed Care – PPO | Admitting: Certified Registered Nurse Anesthetist

## 2011-12-06 ENCOUNTER — Encounter (HOSPITAL_BASED_OUTPATIENT_CLINIC_OR_DEPARTMENT_OTHER): Payer: Self-pay | Admitting: Certified Registered Nurse Anesthetist

## 2011-12-06 ENCOUNTER — Ambulatory Visit (HOSPITAL_BASED_OUTPATIENT_CLINIC_OR_DEPARTMENT_OTHER)
Admission: RE | Admit: 2011-12-06 | Discharge: 2011-12-06 | Disposition: A | Discharge status: Home / Self Care | Payer: BC Managed Care – PPO | Source: Ambulatory Visit | Attending: General Surgery | Admitting: General Surgery

## 2011-12-06 ENCOUNTER — Encounter (HOSPITAL_BASED_OUTPATIENT_CLINIC_OR_DEPARTMENT_OTHER)
Admission: RE | Disposition: A | Discharge status: Home / Self Care | Payer: Self-pay | Source: Ambulatory Visit | Attending: General Surgery

## 2011-12-06 DIAGNOSIS — N61 Mastitis without abscess: Secondary | ICD-10-CM

## 2011-12-06 DIAGNOSIS — Z01812 Encounter for preprocedural laboratory examination: Secondary | ICD-10-CM | POA: Insufficient documentation

## 2011-12-06 SURGERY — INCISION AND DRAINAGE, ABSCESS
Anesthesia: General | Site: Breast | Laterality: Right | Wound class: Clean Contaminated

## 2011-12-06 MED ORDER — PROMETHAZINE HCL 25 MG/ML IJ SOLN: 6.2500 mg | INTRAMUSCULAR | Status: DC | PRN

## 2011-12-06 MED ORDER — LIDOCAINE HCL (CARDIAC) 20 MG/ML IV SOLN
INTRAVENOUS | Status: DC | PRN
  Administered 2011-12-06: 50 mg via INTRAVENOUS

## 2011-12-06 MED ORDER — ONDANSETRON HCL 4 MG/2ML IJ SOLN
INTRAMUSCULAR | Status: DC | PRN
  Administered 2011-12-06: 4 mg via INTRAVENOUS

## 2011-12-06 MED ORDER — CEFAZOLIN SODIUM 1-5 GM-% IV SOLN
1.0000 g | INTRAVENOUS | Status: AC
  Administered 2011-12-06: 1 g via INTRAVENOUS

## 2011-12-06 MED ORDER — BUPIVACAINE HCL (PF) 0.25 % IJ SOLN
INTRAMUSCULAR | Status: DC | PRN
  Administered 2011-12-06: 10 mL

## 2011-12-06 MED ORDER — MIDAZOLAM HCL 5 MG/5ML IJ SOLN
INTRAMUSCULAR | Status: DC | PRN
  Administered 2011-12-06: 2 mg via INTRAVENOUS

## 2011-12-06 MED ORDER — HYDROMORPHONE HCL PF 1 MG/ML IJ SOLN
0.2500 mg | INTRAMUSCULAR | Status: DC | PRN
  Administered 2011-12-06: 0.5 mg via INTRAVENOUS

## 2011-12-06 MED ORDER — DEXAMETHASONE SODIUM PHOSPHATE 4 MG/ML IJ SOLN
INTRAMUSCULAR | Status: DC | PRN
  Administered 2011-12-06: 10 mg via INTRAVENOUS

## 2011-12-06 MED ORDER — MEPERIDINE HCL 25 MG/ML IJ SOLN: 6.2500 mg | INTRAMUSCULAR | Status: DC | PRN

## 2011-12-06 MED ORDER — PROPOFOL 10 MG/ML IV EMUL
INTRAVENOUS | Status: DC | PRN
  Administered 2011-12-06: 150 mg via INTRAVENOUS

## 2011-12-06 MED ORDER — LACTATED RINGERS IV SOLN
INTRAVENOUS | Status: DC
  Administered 2011-12-06: 14:00:00 via INTRAVENOUS

## 2011-12-06 MED ORDER — FENTANYL CITRATE 0.05 MG/ML IJ SOLN
INTRAMUSCULAR | Status: DC | PRN
  Administered 2011-12-06 (×2): 50 ug via INTRAVENOUS
  Administered 2011-12-06: 25 ug via INTRAVENOUS

## 2011-12-06 MED ORDER — OXYCODONE-ACETAMINOPHEN 5-325 MG PO TABS: 1.0000 | ORAL_TABLET | ORAL | Status: AC | PRN

## 2011-12-06 MED ORDER — OXYCODONE HCL 5 MG PO TABS
5.0000 mg | ORAL_TABLET | Freq: Once | ORAL | Status: AC | PRN
  Administered 2011-12-06: 5 mg via ORAL

## 2011-12-06 SURGICAL SUPPLY — 42 items
BENZOIN TINCTURE PRP APPL 2/3 (GAUZE/BANDAGES/DRESSINGS) ×2 IMPLANT
BLADE SURG 15 STRL LF DISP TIS (BLADE) ×1 IMPLANT
BLADE SURG 15 STRL SS (BLADE) ×1
CANISTER SUCTION 1200CC (MISCELLANEOUS) ×2 IMPLANT
CHLORAPREP W/TINT 26ML (MISCELLANEOUS) ×2 IMPLANT
CLOTH BEACON ORANGE TIMEOUT ST (SAFETY) ×2 IMPLANT
COVER MAYO STAND STRL (DRAPES) ×2 IMPLANT
COVER TABLE BACK 60X90 (DRAPES) ×2 IMPLANT
DECANTER SPIKE VIAL GLASS SM (MISCELLANEOUS) IMPLANT
DERMABOND ADVANCED (GAUZE/BANDAGES/DRESSINGS) ×1
DERMABOND ADVANCED .7 DNX12 (GAUZE/BANDAGES/DRESSINGS) ×1 IMPLANT
DRAPE PED LAPAROTOMY (DRAPES) ×2 IMPLANT
DRSG TEGADERM 4X4.75 (GAUZE/BANDAGES/DRESSINGS) ×2 IMPLANT
ELECT COATED BLADE 2.86 ST (ELECTRODE) ×2 IMPLANT
ELECT REM PT RETURN 9FT ADLT (ELECTROSURGICAL) ×2
ELECTRODE REM PT RTRN 9FT ADLT (ELECTROSURGICAL) ×1 IMPLANT
GAUZE SPONGE 4X4 12PLY STRL LF (GAUZE/BANDAGES/DRESSINGS) ×2 IMPLANT
GLOVE BIO SURGEON STRL SZ7 (GLOVE) ×2 IMPLANT
GLOVE BIOGEL PI IND STRL 7.5 (GLOVE) ×1 IMPLANT
GLOVE BIOGEL PI INDICATOR 7.5 (GLOVE) ×1
GLOVE ECLIPSE 6.5 STRL STRAW (GLOVE) ×2 IMPLANT
GOWN PREVENTION PLUS XLARGE (GOWN DISPOSABLE) ×2 IMPLANT
NEEDLE HYPO 25X1 1.5 SAFETY (NEEDLE) ×2 IMPLANT
NS IRRIG 1000ML POUR BTL (IV SOLUTION) IMPLANT
PACK BASIN DAY SURGERY FS (CUSTOM PROCEDURE TRAY) ×2 IMPLANT
PENCIL BUTTON HOLSTER BLD 10FT (ELECTRODE) ×2 IMPLANT
SLEEVE SCD COMPRESS KNEE MED (MISCELLANEOUS) ×2 IMPLANT
SPONGE GAUZE 4X4 12PLY (GAUZE/BANDAGES/DRESSINGS) ×2 IMPLANT
SPONGE LAP 4X18 X RAY DECT (DISPOSABLE) ×2 IMPLANT
STRIP CLOSURE SKIN 1/2X4 (GAUZE/BANDAGES/DRESSINGS) ×2 IMPLANT
SUT MNCRL AB 4-0 PS2 18 (SUTURE) IMPLANT
SUT VIC AB 2-0 SH 27 (SUTURE) ×1
SUT VIC AB 2-0 SH 27XBRD (SUTURE) ×1 IMPLANT
SUT VIC AB 3-0 SH 27 (SUTURE) ×1
SUT VIC AB 3-0 SH 27X BRD (SUTURE) ×1 IMPLANT
SYR BULB 3OZ (MISCELLANEOUS) ×2 IMPLANT
SYR CONTROL 10ML LL (SYRINGE) ×2 IMPLANT
TOWEL OR 17X24 6PK STRL BLUE (TOWEL DISPOSABLE) ×2 IMPLANT
TOWEL OR NON WOVEN STRL DISP B (DISPOSABLE) ×2 IMPLANT
TUBE CONNECTING 20X1/4 (TUBING) ×2 IMPLANT
WATER STERILE IRR 1000ML POUR (IV SOLUTION) ×2 IMPLANT
YANKAUER SUCT BULB TIP NO VENT (SUCTIONS) ×2 IMPLANT
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
## 2011-12-06 NOTE — Anesthesia Postprocedure Evaluation (Signed)
  Anesthesia Post-op Note  Patient: Rachael Manning  Procedure(s) Performed: Procedure(s) (LRB): INCISION AND DRAINAGE ABSCESS (Right)  Patient Location: PACU  Anesthesia Type: General  Level of Consciousness: awake  Airway and Oxygen Therapy: Patient Spontanous Breathing  Post-op Pain: mild  Post-op Assessment: Post-op Vital signs reviewed  Post-op Vital Signs: stable  Complications: No apparent anesthesia complications

## 2011-12-06 NOTE — Transfer of Care (Signed)
Immediate Anesthesia Transfer of Care Note  Patient: Rachael Manning  Procedure(s) Performed: Procedure(s) (LRB): INCISION AND DRAINAGE ABSCESS (Right)  Patient Location: PACU  Anesthesia Type: General  Level of Consciousness: awake, alert , oriented and patient cooperative  Airway & Oxygen Therapy: Patient Spontanous Breathing and Patient connected to face mask oxygen  Post-op Assessment: Report given to PACU RN and Post -op Vital signs reviewed and stable  Post vital signs: Reviewed and stable  Complications: No apparent anesthesia complications

## 2011-12-06 NOTE — Interval H&P Note (Signed)
History and Physical Interval Note:  12/06/2011 3:15 PM  Rachael Manning  has presented today for surgery, with the diagnosis of chronic breast abscess  The various methods of treatment have been discussed with the patient and family. After consideration of risks, benefits and other options for treatment, the patient has consented to  Procedure(s) (LRB): Excision of ductal system and chronic abscess (Right) as a surgical intervention .  The patient's history has been reviewed, patient examined, no change in status, stable for surgery.  I have reviewed the patients' chart and labs.  Questions were answered to the patient's satisfaction.     [name]

## 2011-12-06 NOTE — Anesthesia Procedure Notes (Signed)
Procedure Name: LMA Insertion Date/Time: 12/06/2011 3:22 PM Performed by: Lilibeth Opie D Pre-anesthesia Checklist: Patient identified, Emergency Drugs available, Suction available and Patient being monitored Patient Re-evaluated:Patient Re-evaluated prior to inductionOxygen Delivery Method: Circle System Utilized Preoxygenation: Pre-oxygenation with 100% oxygen Intubation Type: IV induction Ventilation: Mask ventilation without difficulty LMA: LMA inserted LMA Size: 4.0 Number of attempts: 1 Placement Confirmation: positive ETCO2 Tube secured with: Tape Dental Injury: Teeth and Oropharynx as per pre-operative assessment

## 2011-12-06 NOTE — Anesthesia Preprocedure Evaluation (Signed)
Anesthesia Evaluation  Patient identified by MRN, date of birth, ID band Patient awake    Reviewed: Allergy & Precautions, H&P , NPO status , Patient's Chart, lab work & pertinent test results  History of Anesthesia Complications Negative for: history of anesthetic complications  Airway Mallampati: I  Neck ROM: full    Dental No notable dental hx.    Pulmonary neg pulmonary ROS, asthma ,    Pulmonary exam normal       Cardiovascular negative cardio ROS  IRhythm:regular Rate:Normal     Neuro/Psych negative neurological ROS  negative psych ROS   GI/Hepatic negative GI ROS, Neg liver ROS,   Endo/Other  negative endocrine ROS  Renal/GU negative Renal ROS  negative genitourinary   Musculoskeletal   Abdominal   Peds  Hematology negative hematology ROS (+)   Anesthesia Other Findings   Reproductive/Obstetrics negative OB ROS                           Anesthesia Physical Anesthesia Plan  ASA: I  Anesthesia Plan: General and General LMA   Post-op Pain Management:    Induction:   Airway Management Planned:   Additional Equipment:   Intra-op Plan:   Post-operative Plan:   Informed Consent: I have reviewed the patients History and Physical, chart, labs and discussed the procedure including the risks, benefits and alternatives for the proposed anesthesia with the patient or authorized representative who has indicated his/her understanding and acceptance.     Plan Discussed with: CRNA and Surgeon  Anesthesia Plan Comments:         Anesthesia Quick Evaluation

## 2011-12-06 NOTE — Op Note (Signed)
Preoperative diagnosis: Chronic right breast abscess status post multiple drainages with right breast mass Postoperative diagnosis: Same as above Procedure: Excision of chronic right breast abscess Surgeon: Dr. Harden Mo Anesthesia: Gen. Specimens: Right breast tissue marked short stitch superior, long stitch lateral, double stitch deep to pathology Complications: None Drains: Wound partially closed over packing Estimated blood loss: Minimal Sponge and needle count correct at end of operation Disposition of patient to recovery in stable condition  Indications: This is a 21 year old female who continues to have recurrent right breast abscesses. She and I  had a long discussion as she has a persistent mass in this area and has had this drained numerous times. I discussed with her going to the operating room to excise his chronic right breast abscess which is basically her central duct system. She understands the risks and benefits as well as the possibility of a cosmetic defect after the operation.  Procedure: After informed consent was obtained the patient was taken to the operating room. She was ministered cefazolin. She had sequential compression devices placed on her legs. She was placed under general anesthesia without complication. Her right breast was prepped and draped in the standard sterile surgical fashion. A surgical timeout was performed.  I then made a crescent-shaped incision that encompassed the prior site at her areolar border where this had been drained. I excised a small portion of skin. I then used cautery to excise the very chronic hard cavity that approached right up to underneath her nipple. There was still some very cloudy fluid with some very inflamed tissue in this region. I excised this in total and passed off the table as a specimen. Below her nipple there was a rind around it as well. I used the knife to excise this right off of the nipple. This was then cleaned with  all viable tissue that was bleeding. Her nipple was viable upon completion. I then irrigated copiously. I obtained hemostasis. I closed over deep breast tissue a 2-0 Vicryl. I closed her dermis to reapproximate her areola to her skin with 3-0 Vicryl but I left about a half centimeter open and packed the remainder of this with gauze. I would have her come to the office on Friday to remove the gauze. I put  Dermabond on the areas right next to the packing. A sterile dressing was then placed. She tolerated this well was extubated and transferred to recovery stable.

## 2011-12-06 NOTE — Discharge Instructions (Signed)
[facility] Office Phone Number [phone]  BREAST SURGERY POST OP INSTRUCTIONS  Always review your discharge instruction sheet given to you by the facility where your surgery was performed.  IF YOU HAVE DISABILITY OR FAMILY LEAVE FORMS, YOU MUST BRING THEM TO THE OFFICE FOR PROCESSING.  DO NOT GIVE THEM TO YOUR DOCTOR.  1. A prescription for pain medication may be given to you upon discharge.  Take your pain medication as prescribed, if needed.  If narcotic pain medicine is not needed, then you may take acetaminophen (Tylenol), naprosyn (Alleve) or ibuprofen (Advil) as needed. 2. Take your usually prescribed medications unless otherwise directed 3. If you need a refill on your pain medication, please contact your pharmacy.  They will contact our office to request authorization.  Prescriptions will not be filled after 5pm or on week-ends. 4. You should eat very light the first 24 hours after surgery, such as soup, crackers, pudding, etc.  Resume your normal diet the day after surgery. 5. Most patients will experience some swelling and bruising in the breast.  Ice packs and a good support bra will help.  Wear  a sports bra for 72 hours day and night.  After that wear a sports bra during the day until you return to the office. Swelling and bruising can take several days to resolve.  6. It is common to experience some constipation if taking pain medication after surgery.  Increasing fluid intake and taking a stool softener will usually help or prevent this problem from occurring.  A mild laxative (Milk of Magnesia or Miralax) should be taken according to package directions if there are no bowel movements after 48 hours. 7. Remove the bandage on Wednesday or change if it gets wet.  Then keep covered and change daily. There is packing in your would that we will take out Friday in the office.  Do not get this wet until then.   8. ACTIVITIES:  You may resume regular daily activities  (gradually increasing) beginning the next day.  Wearing a good support bra or sports bra minimizes pain and swelling.  You may have sexual intercourse when it is comfortable. a. You may drive when you no longer are taking prescription pain medication, you can comfortably wear a seatbelt, and you can safely maneuver your car and apply brakes. b. RETURN TO WORK:  ______________________________________________________________________________________ 9.   Rachael Manning will call with appt for Friday Expect your pathology report 3-4 business days after your surgery.  You may call to check if you do not hear from [place] after three days. 10. OTHER INSTRUCTIONS: _______________________________________________________________________________________________ _____________________________________________________________________________________________________________________________________ _____________________________________________________________________________________________________________________________________ _____________________________________________________________________________________________________________________________________  WHEN TO CALL DR WAKEFIELD: 1. Fever over 101.0 2. Nausea and/or vomiting. 3. Extreme swelling or bruising. 4. Continued bleeding from incision. 5. Increased pain, redness from the incision.  The clinic staff is available to answer your questions during regular business hours.  Please don't hesitate to call and ask to speak to one of the nurses for clinical concerns.  If you have a medical emergency, go to the nearest emergency room or call 911.  A surgeon from [facility] is always on call at the hospital.  For further questions, please visit [email or web address]  Post Anesthesia Home Care Instructions  Activity: Get plenty of rest for the remainder of the day. A responsible adult should stay with you for 24 hours following the procedure.    For the next 24 hours, DO NOT: -Drive a car -Advertising copywriter -Drink alcoholic beverages -Take  any medication unless instructed by your physician -Make any legal decisions or sign important papers.  Meals: Start with liquid foods such as gelatin or soup. Progress to regular foods as tolerated. Avoid greasy, spicy, heavy foods. If nausea and/or vomiting occur, drink only clear liquids until the nausea and/or vomiting subsides. Call your physician if vomiting continues.  Special Instructions/Symptoms: Your throat may feel dry or sore from the anesthesia or the breathing tube placed in your throat during surgery. If this causes discomfort, gargle with warm salt water. The discomfort should disappear within 24 hours.

## 2011-12-07 ENCOUNTER — Telehealth (INDEPENDENT_AMBULATORY_CARE_PROVIDER_SITE_OTHER): Payer: Self-pay

## 2011-12-07 NOTE — Telephone Encounter (Signed)
LMOM for pt to call me back b/c I need to schedule a nurse visit with pt for this Friday 7/5.

## 2011-12-08 NOTE — Telephone Encounter (Signed)
LMOM with pt x2. I need to set up a nurse only visit with pt for Friday per Dr Dwain Sarna.

## 2011-12-08 NOTE — Telephone Encounter (Signed)
Pt returned my call. We sch. A time for her to come see me on Friday at 10:15.

## 2011-12-10 ENCOUNTER — Encounter (INDEPENDENT_AMBULATORY_CARE_PROVIDER_SITE_OTHER): Payer: BC Managed Care – PPO

## 2011-12-13 ENCOUNTER — Telehealth (INDEPENDENT_AMBULATORY_CARE_PROVIDER_SITE_OTHER): Payer: Self-pay

## 2011-12-13 NOTE — Telephone Encounter (Signed)
Called pt to ask what happened to her since she didn't show for her nurse only visit on Friday to have her packing removed. The pt stated she removed the packing her self this weekend but forgot to call me to let me know. I told the pt that we would f/u with her on her next appt with Dr Dwain Sarna.

## 2011-12-15 ENCOUNTER — Telehealth (INDEPENDENT_AMBULATORY_CARE_PROVIDER_SITE_OTHER): Payer: Self-pay | Admitting: General Surgery

## 2011-12-15 NOTE — Telephone Encounter (Signed)
Pt calling for pain med refill;  Hydrocodone 5/325 mg, # 30,  1-2 po Q 4-6 H prn , no refill called to [facility]:  [phone].

## 2011-12-27 ENCOUNTER — Encounter (INDEPENDENT_AMBULATORY_CARE_PROVIDER_SITE_OTHER): Payer: Self-pay | Admitting: General Surgery

## 2011-12-27 ENCOUNTER — Ambulatory Visit (INDEPENDENT_AMBULATORY_CARE_PROVIDER_SITE_OTHER): Payer: BC Managed Care – PPO | Admitting: General Surgery

## 2011-12-27 VITALS — BP 100/62 | HR 74 | Resp 18 | Ht 62.0 in | Wt 124.0 lb

## 2011-12-27 DIAGNOSIS — Z09 Encounter for follow-up examination after completed treatment for conditions other than malignant neoplasm: Secondary | ICD-10-CM

## 2011-12-28 ENCOUNTER — Encounter (INDEPENDENT_AMBULATORY_CARE_PROVIDER_SITE_OTHER): Payer: Self-pay | Admitting: General Surgery

## 2011-12-28 NOTE — Progress Notes (Signed)
Subjective:     Patient ID: Rachael Manning, female   DOB: 1991/04/02, 21 y.o.   MRN: [id number]  HPI 89 yof with chronic right breast abscess who missed several follow ups.  We eventually went to or recently and excised a chronically infected ductal system and cavity.  She returns today with pain at the site and swelling but is otherwise well.  Her path is benign.  Review of Systems     Objective:   Physical Exam Right breast incision clean, she has small seroma and is tender but no real evidence of infection     Assessment:     S/p excision of right breast chronic abscess and ductal system    Plan:     I think she is improving and don't see any infection.  Will return in 2-3 weeks

## 2011-12-29 ENCOUNTER — Other Ambulatory Visit (INDEPENDENT_AMBULATORY_CARE_PROVIDER_SITE_OTHER): Payer: Self-pay | Admitting: General Surgery

## 2011-12-29 ENCOUNTER — Telehealth (INDEPENDENT_AMBULATORY_CARE_PROVIDER_SITE_OTHER): Payer: Self-pay

## 2011-12-29 DIAGNOSIS — Z09 Encounter for follow-up examination after completed treatment for conditions other than malignant neoplasm: Secondary | ICD-10-CM

## 2011-12-29 MED ORDER — HYDROCODONE-ACETAMINOPHEN 10-325 MG PO TABS: 1.0000 | ORAL_TABLET | Freq: Four times a day (QID) | ORAL | Status: DC | PRN

## 2011-12-29 NOTE — Telephone Encounter (Signed)
Patient wants refill of hydrocodone. Is it ok to refill?

## 2011-12-29 NOTE — Progress Notes (Signed)
Called with continued pain at site, I did not think this was infected two days ago.  Doesn't sound like it is now.  She is leaving town right now.  I called in some pain meds and warning signs for infection and asked her to call if this worsens as she may need to be seen sooner

## 2012-01-21 ENCOUNTER — Encounter (INDEPENDENT_AMBULATORY_CARE_PROVIDER_SITE_OTHER): Payer: BC Managed Care – PPO | Admitting: General Surgery

## 2012-01-24 ENCOUNTER — Encounter (INDEPENDENT_AMBULATORY_CARE_PROVIDER_SITE_OTHER): Payer: BC Managed Care – PPO | Admitting: General Surgery

## 2012-02-02 ENCOUNTER — Emergency Department (HOSPITAL_COMMUNITY)
Admission: EM | Admit: 2012-02-02 | Discharge: 2012-02-02 | Disposition: A | Discharge status: Home / Self Care | Payer: BC Managed Care – PPO | Attending: Emergency Medicine | Admitting: Emergency Medicine

## 2012-02-02 ENCOUNTER — Encounter (HOSPITAL_COMMUNITY): Payer: Self-pay | Admitting: Family Medicine

## 2012-02-02 DIAGNOSIS — J45909 Unspecified asthma, uncomplicated: Secondary | ICD-10-CM | POA: Insufficient documentation

## 2012-02-02 DIAGNOSIS — N61 Mastitis without abscess: Secondary | ICD-10-CM | POA: Insufficient documentation

## 2012-02-02 DIAGNOSIS — N611 Abscess of the breast and nipple: Secondary | ICD-10-CM

## 2012-02-02 MED ORDER — OXYCODONE-ACETAMINOPHEN 5-325 MG PO TABS
2.0000 | ORAL_TABLET | Freq: Once | ORAL | Status: AC
  Administered 2012-02-02: 2 via ORAL
  Filled 2012-02-02: qty 2

## 2012-02-02 MED ORDER — OXYCODONE-ACETAMINOPHEN 5-325 MG PO TABS: ORAL_TABLET | ORAL | Status: AC

## 2012-02-02 MED ORDER — SULFAMETHOXAZOLE-TRIMETHOPRIM 800-160 MG PO TABS: 1.0000 | ORAL_TABLET | Freq: Two times a day (BID) | ORAL | Status: AC

## 2012-02-02 MED ORDER — CEPHALEXIN 500 MG PO CAPS: 500.0000 mg | ORAL_CAPSULE | Freq: Four times a day (QID) | ORAL | Status: AC
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
## 2012-02-02 NOTE — ED Notes (Signed)
Per pt sts possible right breast infection. sts has had multiple surgeries in the past. Red, painful and draining

## 2012-02-03 NOTE — ED Provider Notes (Signed)
Medical screening examination/treatment/procedure(s) were performed by non-physician practitioner and as supervising physician I was immediately available for consultation/collaboration.    Nelia Shi, MD 02/03/12 2251

## 2012-02-09 ENCOUNTER — Encounter (INDEPENDENT_AMBULATORY_CARE_PROVIDER_SITE_OTHER): Payer: Self-pay | Admitting: General Surgery

## 2012-02-22 ENCOUNTER — Emergency Department (HOSPITAL_COMMUNITY)
Admission: EM | Admit: 2012-02-22 | Discharge: 2012-02-22 | Disposition: A | Discharge status: Home / Self Care | Payer: BC Managed Care – PPO | Attending: Emergency Medicine | Admitting: Emergency Medicine

## 2012-02-22 ENCOUNTER — Ambulatory Visit (INDEPENDENT_AMBULATORY_CARE_PROVIDER_SITE_OTHER): Payer: BC Managed Care – PPO | Admitting: Surgery

## 2012-02-22 ENCOUNTER — Encounter (INDEPENDENT_AMBULATORY_CARE_PROVIDER_SITE_OTHER): Payer: Self-pay | Admitting: Surgery

## 2012-02-22 ENCOUNTER — Encounter (HOSPITAL_COMMUNITY): Payer: Self-pay | Admitting: *Deleted

## 2012-02-22 VITALS — BP 112/74 | HR 72 | Temp 97.4°F | Resp 14 | Ht 63.0 in | Wt 122.8 lb

## 2012-02-22 DIAGNOSIS — N611 Abscess of the breast and nipple: Secondary | ICD-10-CM | POA: Insufficient documentation

## 2012-02-22 DIAGNOSIS — N61 Mastitis without abscess: Secondary | ICD-10-CM | POA: Insufficient documentation

## 2012-02-22 DIAGNOSIS — J45909 Unspecified asthma, uncomplicated: Secondary | ICD-10-CM | POA: Insufficient documentation

## 2012-02-22 MED ORDER — OXYCODONE-ACETAMINOPHEN 5-325 MG PO TABS: 1.0000 | ORAL_TABLET | Freq: Four times a day (QID) | ORAL | Status: DC | PRN

## 2012-02-22 MED ORDER — CEPHALEXIN 250 MG PO CAPS: 250.0000 mg | ORAL_CAPSULE | Freq: Four times a day (QID) | ORAL | Status: DC

## 2012-02-22 MED ORDER — OXYCODONE-ACETAMINOPHEN 5-325 MG PO TABS
2.0000 | ORAL_TABLET | Freq: Once | ORAL | Status: AC
  Administered 2012-02-22: 2 via ORAL
  Filled 2012-02-22: qty 2

## 2012-02-22 NOTE — ED Notes (Signed)
Patient presents stating that she has a lump on her right breast and her back and arm are hurting.  Approx 1 year ago she started seeing Dr. Dwain Sarna and had 5 surgeries on the same breast.  When asked if she could feel the lump she stated that she could touch it because it hurt so bad

## 2012-02-22 NOTE — ED Notes (Signed)
The pt has had a rt breast abscess for a year untermittently.  The wound drained on Saturday then closed up again
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
## 2012-02-22 NOTE — ED Provider Notes (Signed)
Patient seen in conjunction with FNP for evaluation on recurrent right breast pain and swelling. Pt reports 5 previous episode of right breast abscess treated by local [facility], Dr. Dwain Sarna. Pain began 3d ago. Developed some draining yesterday. Fluctuant lesion approx 2cm x 2cm fluid fluctuant and exquisitely tender lesion over medial aspect of aerola, 2cm x 2cm fluid collection seen here on U/S. WE will manage symptomatically and initiate antibiotic therapy. I have explained to the patient that she should call Dr. Doreen Salvage office at 8am and schedule follow up in the next 24 hrs. She will likely need repeat surgical procedure.   Brandt Loosen, MD 02/22/12 [phone]

## 2012-02-22 NOTE — Progress Notes (Signed)
[facility]  Ovidio Kin, MD,  FACS [address]  [address] [place], [place]    [address] Phone:  [phone] FAX:  [phone]   Re:   Rachael Manning DOB:   1990-12-03 MRN:   [id number]  Urgent Office  ASSESSMENT AND PLAN: 1.  Right breast cellulitis, recurrent.  3 o'clock.  She has been seen in the [facility] ER twice in the last 2 weeks.  She has antibiotics, which she has not started.  She will continue warm soaks to the right breast, take antibiotics, and see Dr. Dwain Sarna back in the next 7 to 10 days.  HISTORY OF PRESENT ILLNESS: No chief complaint on file.   Rachael Manning is a 21 y.o. (DOB: 06-27-1990)  AA female who is a patient of Provider Not In System and comes to me today for recurrent right breast infection/abscess..  Patient with multiple I&D and excisions of right breast abscess - Drs Dennison Bulla, and Derrell Lolling.  Last attempt at excision on 12/06/2011 by Dr. Dwain Sarna.  She has been managed by Dr. Dwain Sarna most recently.  She last spoke with Dr. Dwain Sarna on 12/29/2011.  She started having right breast pain about 2 weeks ago. She went to the [facility] ER, who referred her to a dermatologist.  She has not seen that dermatologist.  She had worse pain and went to the [facility] ER again  last pm and they referred her back to our office.  They gave her pain meds and Keflex 250 mg QID (she has not started the antibx).  Over this past weekend, the area in the right breast did drain a little.  It is not draining now.  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  PHYSICAL EXAM: BP 112/74  Pulse 72  Temp 97.4 F (36.3 C)  Resp 14  Ht 5\' 3"  (1.6 m)  Wt 122 lb 12.8 oz (55.702 kg)  BMI 21.75 kg/m2  LMP 01/25/2012  Breast:  Right - hard, tender mass at 3 o'clock about 2.5 cm.  I ultrasounded the mass.  There is minimal fluid in this mass and I think at this time, it is more induration.  I do not think that there is anything to drain  today.  [place] pictures are taken - in paper chart.   DATA REVIEWED: Old chart.  Ovidio Kin, MD, FACS Office:  [phone]

## 2012-02-24 ENCOUNTER — Telehealth (INDEPENDENT_AMBULATORY_CARE_PROVIDER_SITE_OTHER): Payer: Self-pay

## 2012-02-24 NOTE — Telephone Encounter (Signed)
Left detailed message stating that we were r/s the appt from 9/23 to 9/25 to arrive at 10:30 to see Dr Dwain Sarna for recheck on her breast.

## 2012-02-28 ENCOUNTER — Encounter (INDEPENDENT_AMBULATORY_CARE_PROVIDER_SITE_OTHER): Payer: BC Managed Care – PPO | Admitting: General Surgery

## 2012-03-01 ENCOUNTER — Encounter (INDEPENDENT_AMBULATORY_CARE_PROVIDER_SITE_OTHER): Payer: BC Managed Care – PPO | Admitting: General Surgery

## 2012-03-04 ENCOUNTER — Emergency Department (HOSPITAL_COMMUNITY): Payer: BC Managed Care – PPO

## 2012-03-04 ENCOUNTER — Emergency Department (HOSPITAL_COMMUNITY)
Admission: EM | Admit: 2012-03-04 | Discharge: 2012-03-04 | Disposition: A | Discharge status: Home / Self Care | Payer: BC Managed Care – PPO | Attending: Emergency Medicine | Admitting: Emergency Medicine

## 2012-03-04 DIAGNOSIS — W19XXXA Unspecified fall, initial encounter: Secondary | ICD-10-CM | POA: Insufficient documentation

## 2012-03-04 DIAGNOSIS — S8990XA Unspecified injury of unspecified lower leg, initial encounter: Secondary | ICD-10-CM | POA: Insufficient documentation

## 2012-03-04 DIAGNOSIS — S99929A Unspecified injury of unspecified foot, initial encounter: Secondary | ICD-10-CM | POA: Insufficient documentation

## 2012-03-04 DIAGNOSIS — Y9301 Activity, walking, marching and hiking: Secondary | ICD-10-CM | POA: Insufficient documentation

## 2012-03-04 LAB — POCT PREGNANCY, URINE: Preg Test, Ur: NEGATIVE

## 2012-03-04 MED ORDER — TRAMADOL HCL 50 MG PO TABS
50.0000 mg | ORAL_TABLET | Freq: Once | ORAL | Status: AC
  Administered 2012-03-04: 50 mg via ORAL
  Filled 2012-03-04: qty 1

## 2012-03-04 MED ORDER — TRAMADOL HCL 50 MG PO TABS: 50.0000 mg | ORAL_TABLET | Freq: Four times a day (QID) | ORAL | Status: DC | PRN

## 2012-03-04 NOTE — ED Notes (Signed)
In and out cath completed using sterile technique. Cloudy urine return.
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
## 2012-03-04 NOTE — ED Notes (Signed)
Paged ortho tech 

## 2012-03-04 NOTE — ED Notes (Signed)
Ortho placed knee immobilizer on right leg

## 2012-03-04 NOTE — ED Notes (Signed)
Patient transported to X-ray 

## 2012-03-04 NOTE — Progress Notes (Signed)
Orthopedic Tech Progress Note Patient Details:  Rachael Manning 21-May-1991 [id number]  Patient ID: [name], female   DOB: Oct 15, 1990, 21 y.o.   MRN: [id number] Knee immobilizer applied.   Leo Grosser T 03/04/2012, 7:49 AM

## 2012-03-04 NOTE — ED Notes (Signed)
Removed patient from [facility] per EDP.
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
## 2012-03-07 ENCOUNTER — Encounter (INDEPENDENT_AMBULATORY_CARE_PROVIDER_SITE_OTHER): Payer: Self-pay | Admitting: General Surgery

## 2012-04-10 ENCOUNTER — Emergency Department (HOSPITAL_COMMUNITY)
Admission: EM | Admit: 2012-04-10 | Discharge: 2012-04-11 | Disposition: A | Discharge status: Home / Self Care | Payer: BC Managed Care – PPO | Attending: Emergency Medicine | Admitting: Emergency Medicine

## 2012-04-10 ENCOUNTER — Encounter (HOSPITAL_COMMUNITY): Payer: Self-pay | Admitting: *Deleted

## 2012-04-10 DIAGNOSIS — Z8709 Personal history of other diseases of the respiratory system: Secondary | ICD-10-CM | POA: Insufficient documentation

## 2012-04-10 DIAGNOSIS — J45909 Unspecified asthma, uncomplicated: Secondary | ICD-10-CM | POA: Insufficient documentation

## 2012-04-10 DIAGNOSIS — N61 Mastitis without abscess: Secondary | ICD-10-CM | POA: Insufficient documentation

## 2012-04-10 DIAGNOSIS — N644 Mastodynia: Secondary | ICD-10-CM | POA: Insufficient documentation

## 2012-04-10 DIAGNOSIS — Z79899 Other long term (current) drug therapy: Secondary | ICD-10-CM | POA: Insufficient documentation

## 2012-04-10 NOTE — ED Notes (Signed)
Pt reports rt breast abscess x1 year - states it started swelling and becoming painful this past week, denies fever, chills, or body aches.

## 2012-04-11 MED ORDER — HYDROCODONE-ACETAMINOPHEN 5-325 MG PO TABS
1.0000 | ORAL_TABLET | Freq: Once | ORAL | Status: AC
  Administered 2012-04-11: 1 via ORAL
  Filled 2012-04-11: qty 1

## 2012-04-11 MED ORDER — SULFAMETHOXAZOLE-TRIMETHOPRIM 800-160 MG PO TABS: 1.0000 | ORAL_TABLET | Freq: Two times a day (BID) | ORAL | Status: DC
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
## 2012-05-19 ENCOUNTER — Ambulatory Visit (INDEPENDENT_AMBULATORY_CARE_PROVIDER_SITE_OTHER): Payer: BC Managed Care – PPO | Admitting: General Surgery

## 2012-05-19 ENCOUNTER — Encounter (INDEPENDENT_AMBULATORY_CARE_PROVIDER_SITE_OTHER): Payer: Self-pay | Admitting: General Surgery

## 2012-05-19 VITALS — BP 124/70 | HR 68 | Temp 97.9°F | Resp 18 | Ht 62.0 in | Wt 122.0 lb

## 2012-05-19 DIAGNOSIS — N63 Unspecified lump in unspecified breast: Secondary | ICD-10-CM

## 2012-05-19 NOTE — Patient Instructions (Addendum)
Follow up with Dr Dwain Sarna about your breast pain
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
## 2012-06-19 ENCOUNTER — Encounter (INDEPENDENT_AMBULATORY_CARE_PROVIDER_SITE_OTHER): Payer: BC Managed Care – PPO | Admitting: General Surgery

## 2012-06-21 ENCOUNTER — Encounter (INDEPENDENT_AMBULATORY_CARE_PROVIDER_SITE_OTHER): Payer: Self-pay | Admitting: General Surgery

## 2012-11-13 ENCOUNTER — Telehealth (INDEPENDENT_AMBULATORY_CARE_PROVIDER_SITE_OTHER): Payer: Self-pay

## 2012-11-13 NOTE — Telephone Encounter (Signed)
Patient states she is having pain in her right breast she is now two months  pregnant . Wants to know if this is going to have problems breast feeding. I express she needed to be seen if she thought she had area that may be inflamed . She decline to be seen at this time. She would call her GYN

## 2014-10-28 ENCOUNTER — Emergency Department (HOSPITAL_COMMUNITY)
Admission: EM | Admit: 2014-10-28 | Discharge: 2014-10-28 | Disposition: A | Discharge status: Home / Self Care | Payer: BLUE CROSS/BLUE SHIELD | Attending: Emergency Medicine | Admitting: Emergency Medicine

## 2014-10-28 ENCOUNTER — Encounter (HOSPITAL_COMMUNITY): Payer: Self-pay

## 2014-10-28 DIAGNOSIS — Z79899 Other long term (current) drug therapy: Secondary | ICD-10-CM | POA: Diagnosis not present

## 2014-10-28 DIAGNOSIS — S0501XA Injury of conjunctiva and corneal abrasion without foreign body, right eye, initial encounter: Secondary | ICD-10-CM

## 2014-10-28 DIAGNOSIS — X58XXXA Exposure to other specified factors, initial encounter: Secondary | ICD-10-CM | POA: Insufficient documentation

## 2014-10-28 DIAGNOSIS — Z8742 Personal history of other diseases of the female genital tract: Secondary | ICD-10-CM | POA: Insufficient documentation

## 2014-10-28 DIAGNOSIS — Y999 Unspecified external cause status: Secondary | ICD-10-CM | POA: Diagnosis not present

## 2014-10-28 DIAGNOSIS — Y929 Unspecified place or not applicable: Secondary | ICD-10-CM | POA: Insufficient documentation

## 2014-10-28 DIAGNOSIS — Y939 Activity, unspecified: Secondary | ICD-10-CM | POA: Insufficient documentation

## 2014-10-28 DIAGNOSIS — S0591XA Unspecified injury of right eye and orbit, initial encounter: Secondary | ICD-10-CM | POA: Diagnosis present

## 2014-10-28 DIAGNOSIS — J45909 Unspecified asthma, uncomplicated: Secondary | ICD-10-CM | POA: Diagnosis not present

## 2014-10-28 MED ORDER — IBUPROFEN 200 MG PO TABS
600.0000 mg | ORAL_TABLET | Freq: Once | ORAL | Status: AC
  Administered 2014-10-28: 600 mg via ORAL
  Filled 2014-10-28: qty 3

## 2014-10-28 MED ORDER — ERYTHROMYCIN 5 MG/GM OP OINT
TOPICAL_OINTMENT | OPHTHALMIC | Status: AC
  Administered 2014-10-28: 23:00:00 via OPHTHALMIC
  Filled 2014-10-28: qty 3.5

## 2014-10-28 MED ORDER — TETRACAINE HCL 0.5 % OP SOLN
1.0000 [drp] | Freq: Once | OPHTHALMIC | Status: AC
  Administered 2014-10-28: 1 [drp] via OPHTHALMIC
  Filled 2014-10-28: qty 2

## 2014-10-28 MED ORDER — FLUORESCEIN SODIUM 1 MG OP STRP
1.0000 | ORAL_STRIP | Freq: Once | OPHTHALMIC | Status: AC
  Administered 2014-10-28: 1 via OPHTHALMIC
  Filled 2014-10-28: qty 1

## 2014-10-28 MED ORDER — ERYTHROMYCIN 5 MG/GM OP OINT: TOPICAL_OINTMENT | Freq: Four times a day (QID) | OPHTHALMIC | Status: DC

## 2014-10-28 NOTE — ED Notes (Signed)
Pt states that her right eye was draining and swollen since yesterday, she complains that it itches and burns.

## 2014-10-28 NOTE — Discharge Instructions (Signed)
Corneal Abrasion The cornea is the clear covering at the front and center of the eye. When looking at the colored portion of the eye (iris), you are looking through the cornea. This very thin tissue is made up of many layers. The surface layer is a single layer of cells (corneal epithelium) and is one of the most sensitive tissues in the body. If a scratch or injury causes the corneal epithelium to come off, it is called a corneal abrasion. If the injury extends to the tissues below the epithelium, the condition is called a corneal ulcer. CAUSES   Scratches.  Trauma.  Foreign body in the eye. Some people have recurrences of abrasions in the area of the original injury even after it has healed (recurrent erosion syndrome). Recurrent erosion syndrome generally improves and goes away with time. SYMPTOMS   Eye pain.  Difficulty or inability to keep the injured eye open.  The eye becomes very sensitive to light.  Recurrent erosions tend to happen suddenly, first thing in the morning, usually after waking up and opening the eye. DIAGNOSIS  Your health care provider can diagnose a corneal abrasion during an eye exam. Dye is usually placed in the eye using a drop or a small paper strip moistened by your tears. When the eye is examined with a special light, the abrasion shows up clearly because of the dye. TREATMENT   Small abrasions may be treated with antibiotic drops or ointment alone.  A pressure patch may be put over the eye. If this is done, follow your doctor's instructions for when to remove the patch. Do not drive or use machines while the eye patch is on. Judging distances is hard to do with a patch on. If the abrasion becomes infected and spreads to the deeper tissues of the cornea, a corneal ulcer can result. This is serious because it can cause corneal scarring. Corneal scars interfere with light passing through the cornea and cause a loss of vision in the involved eye. HOME CARE  INSTRUCTIONS  Use medicine or ointment as directed. Only take over-the-counter or prescription medicines for pain, discomfort, or fever as directed by your health care provider.  Do not drive or operate machinery if your eye is patched. Your ability to judge distances is impaired.  If your health care provider has given you a follow-up appointment, it is very important to keep that appointment. Not keeping the appointment could result in a severe eye infection or permanent loss of vision. If there is any problem keeping the appointment, let your health care provider know. SEEK MEDICAL CARE IF:   You have pain, light sensitivity, and a scratchy feeling in one eye or both eyes.  Your pressure patch keeps loosening up, and you can blink your eye under the patch after treatment.  Any kind of discharge develops from the eye after treatment or if the lids stick together in the morning.  You have the same symptoms in the morning as you did with the original abrasion days, weeks, or months after the abrasion healed. MAKE SURE YOU:   Understand these instructions.  Will watch your condition.  Will get help right away if you are not doing well or get worse. Document Released: 05/21/2000 Document Revised: 05/29/2013 Document Reviewed: 01/29/2013 [facility] Patient Information 2015 [employer], [place]. This information is not intended to replace advice given to you by your health care provider. Make sure you discuss any questions you have with your health care provider. As discussed to  better allow the medication to absorb in your eye place a warm cloth over the eye until it starts cooling, usually 2-3 minutes, then use the ointment 4 times a day for the next 5 days if you see no improvement in the next 2-3 days.  Please make an appointment with the ophthalmologist for further evaluation
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
## 2015-01-03 ENCOUNTER — Emergency Department (HOSPITAL_COMMUNITY)
Admission: EM | Admit: 2015-01-03 | Discharge: 2015-01-03 | Disposition: A | Discharge status: Home / Self Care | Payer: BLUE CROSS/BLUE SHIELD | Attending: Emergency Medicine | Admitting: Emergency Medicine

## 2015-01-03 ENCOUNTER — Emergency Department (HOSPITAL_COMMUNITY): Payer: BLUE CROSS/BLUE SHIELD

## 2015-01-03 ENCOUNTER — Encounter (HOSPITAL_COMMUNITY): Payer: Self-pay | Admitting: Emergency Medicine

## 2015-01-03 DIAGNOSIS — K2970 Gastritis, unspecified, without bleeding: Secondary | ICD-10-CM | POA: Diagnosis not present

## 2015-01-03 DIAGNOSIS — R112 Nausea with vomiting, unspecified: Secondary | ICD-10-CM | POA: Insufficient documentation

## 2015-01-03 DIAGNOSIS — Z331 Pregnant state, incidental: Secondary | ICD-10-CM | POA: Insufficient documentation

## 2015-01-03 DIAGNOSIS — R1010 Upper abdominal pain, unspecified: Secondary | ICD-10-CM | POA: Diagnosis present

## 2015-01-03 DIAGNOSIS — J45909 Unspecified asthma, uncomplicated: Secondary | ICD-10-CM | POA: Diagnosis not present

## 2015-01-03 DIAGNOSIS — Z3491 Encounter for supervision of normal pregnancy, unspecified, first trimester: Secondary | ICD-10-CM

## 2015-01-03 DIAGNOSIS — R1011 Right upper quadrant pain: Secondary | ICD-10-CM | POA: Insufficient documentation

## 2015-01-03 DIAGNOSIS — Z8742 Personal history of other diseases of the female genital tract: Secondary | ICD-10-CM | POA: Diagnosis not present

## 2015-01-03 DIAGNOSIS — R1013 Epigastric pain: Secondary | ICD-10-CM | POA: Diagnosis not present

## 2015-01-03 LAB — CBC
HCT: 35.8 % — ABNORMAL LOW (ref 36.0–46.0)
Hemoglobin: 11.9 g/dL — ABNORMAL LOW (ref 12.0–15.0)
MCH: 26.1 pg (ref 26.0–34.0)
MCHC: 33.2 g/dL (ref 30.0–36.0)
MCV: 78.5 fL (ref 78.0–100.0)
PLATELETS: 304 10*3/uL (ref 150–400)
RBC: 4.56 MIL/uL (ref 3.87–5.11)
RDW: 14.5 % (ref 11.5–15.5)
WBC: 8.8 10*3/uL (ref 4.0–10.5)

## 2015-01-03 LAB — COMPREHENSIVE METABOLIC PANEL
ALBUMIN: 3.7 g/dL (ref 3.5–5.0)
ALK PHOS: 42 U/L (ref 38–126)
ALT: 10 U/L — ABNORMAL LOW (ref 14–54)
ANION GAP: 7 (ref 5–15)
AST: 16 U/L (ref 15–41)
BILIRUBIN TOTAL: 1.3 mg/dL — AB (ref 0.3–1.2)
BUN: 5 mg/dL — AB (ref 6–20)
CHLORIDE: 104 mmol/L (ref 101–111)
CO2: 23 mmol/L (ref 22–32)
CREATININE: 0.75 mg/dL (ref 0.44–1.00)
Calcium: 9.6 mg/dL (ref 8.9–10.3)
GFR calc Af Amer: >60 mL/min (ref >60)
GLUCOSE: 106 mg/dL — AB (ref 65–99)
POTASSIUM: 3.9 mmol/L (ref 3.5–5.1)
SODIUM: 134 mmol/L — AB (ref 135–145)
Total Protein: 7.2 g/dL (ref 6.5–8.1)

## 2015-01-03 LAB — URINALYSIS, ROUTINE W REFLEX MICROSCOPIC
BILIRUBIN URINE: NEGATIVE
Glucose, UA: NEGATIVE mg/dL
HGB URINE DIPSTICK: NEGATIVE
Ketones, ur: >80 mg/dL — AB
NITRITE: NEGATIVE
PH: 6.5 (ref 5.0–8.0)
PROTEIN: NEGATIVE mg/dL
SPECIFIC GRAVITY, URINE: 1.022 (ref 1.005–1.030)
UROBILINOGEN UA: 1 mg/dL (ref 0.0–1.0)

## 2015-01-03 LAB — ABO/RH: ABO/RH(D): B POS

## 2015-01-03 LAB — URINE MICROSCOPIC-ADD ON

## 2015-01-03 LAB — POC URINE PREG, ED: PREG TEST UR: POSITIVE — AB

## 2015-01-03 LAB — HCG, QUANTITATIVE, PREGNANCY: hCG, Beta Chain, Quant, S: 128092 m[IU]/mL — ABNORMAL HIGH (ref <5)

## 2015-01-03 LAB — LIPASE, BLOOD: LIPASE: 31 U/L (ref 22–51)

## 2015-01-03 MED ORDER — PANTOPRAZOLE SODIUM 40 MG PO TBEC: 40.0000 mg | DELAYED_RELEASE_TABLET | Freq: Every day | ORAL | Status: DC

## 2015-01-03 MED ORDER — PANTOPRAZOLE SODIUM 40 MG PO TBEC
40.0000 mg | DELAYED_RELEASE_TABLET | Freq: Once | ORAL | Status: AC
  Administered 2015-01-03: 40 mg via ORAL
  Filled 2015-01-03: qty 1

## 2015-01-03 MED ORDER — ONDANSETRON HCL 4 MG PO TABS: 4.0000 mg | ORAL_TABLET | Freq: Three times a day (TID) | ORAL | Status: DC | PRN

## 2015-01-03 MED ORDER — PROMETHAZINE HCL 25 MG PO TABS: 25.0000 mg | ORAL_TABLET | Freq: Four times a day (QID) | ORAL | Status: DC | PRN

## 2015-01-03 MED ORDER — SUCRALFATE 1 G PO TABS: 1.0000 g | ORAL_TABLET | Freq: Three times a day (TID) | ORAL | Status: DC

## 2015-01-03 MED ORDER — HYDROMORPHONE HCL 1 MG/ML IJ SOLN
1.0000 mg | Freq: Once | INTRAMUSCULAR | Status: AC
  Administered 2015-01-03: 1 mg via INTRAVENOUS
  Filled 2015-01-03: qty 1

## 2015-01-03 MED ORDER — GI COCKTAIL ~~LOC~~
30.0000 mL | Freq: Once | ORAL | Status: AC
  Administered 2015-01-03: 30 mL via ORAL
  Filled 2015-01-03: qty 30

## 2015-01-03 MED ORDER — SODIUM CHLORIDE 0.9 % IV BOLUS (SEPSIS)
1000.0000 mL | Freq: Once | INTRAVENOUS | Status: AC
  Administered 2015-01-03: 1000 mL via INTRAVENOUS

## 2015-01-03 MED ORDER — ONDANSETRON HCL 4 MG/2ML IJ SOLN
4.0000 mg | Freq: Once | INTRAMUSCULAR | Status: AC
  Administered 2015-01-03: 4 mg via INTRAVENOUS
  Filled 2015-01-03: qty 2

## 2015-01-03 NOTE — ED Notes (Signed)
Pt returned from ultrasound

## 2015-01-03 NOTE — ED Notes (Signed)
Pt. Stated, I started throwing up yesterday and nothing I eat or drink will stay down.

## 2015-01-03 NOTE — ED Notes (Signed)
Pt in ultrasound
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
## 2015-01-03 NOTE — ED Notes (Signed)
PT was not able to provide urine specimen at this time.

## 2015-01-03 NOTE — Discharge Instructions (Signed)
Gastritis, Adult Gastritis is soreness and swelling (inflammation) of the lining of the stomach. Gastritis can develop as a sudden onset (acute) or long-term (chronic) condition. If gastritis is not treated, it can lead to stomach bleeding and ulcers. CAUSES  Gastritis occurs when the stomach lining is weak or damaged. Digestive juices from the stomach then inflame the weakened stomach lining. The stomach lining may be weak or damaged due to viral or bacterial infections. One common bacterial infection is the Helicobacter pylori infection. Gastritis can also result from excessive alcohol consumption, taking certain medicines, or having too much acid in the stomach.  SYMPTOMS  In some cases, there are no symptoms. When symptoms are present, they may include:  Pain or a burning sensation in the upper abdomen.  Nausea.  Vomiting.  An uncomfortable feeling of fullness after eating. DIAGNOSIS  Your caregiver may suspect you have gastritis based on your symptoms and a physical exam. To determine the cause of your gastritis, your caregiver may perform the following:  Blood or stool tests to check for the H pylori bacterium.  Gastroscopy. A thin, flexible tube (endoscope) is passed down the esophagus and into the stomach. The endoscope has a light and camera on the end. Your caregiver uses the endoscope to view the inside of the stomach.  Taking a tissue sample (biopsy) from the stomach to examine under a microscope. TREATMENT  Depending on the cause of your gastritis, medicines may be prescribed. If you have a bacterial infection, such as an H pylori infection, antibiotics may be given. If your gastritis is caused by too much acid in the stomach, H2 blockers or antacids may be given. Your caregiver may recommend that you stop taking aspirin, ibuprofen, or other nonsteroidal anti-inflammatory drugs (NSAIDs). HOME CARE INSTRUCTIONS  Only take over-the-counter or prescription medicines as directed by  your caregiver.  If you were given antibiotic medicines, take them as directed. Finish them even if you start to feel better.  Drink enough fluids to keep your urine clear or pale yellow.  Avoid foods and drinks that make your symptoms worse, such as:  Caffeine or alcoholic drinks.  Chocolate.  Peppermint or mint flavorings.  Garlic and onions.  Spicy foods.  Citrus fruits, such as oranges, lemons, or limes.  Tomato-based foods such as sauce, chili, salsa, and pizza.  Fried and fatty foods.  Eat small, frequent meals instead of large meals. SEEK IMMEDIATE MEDICAL CARE IF:   You have black or dark red stools.  You vomit blood or material that looks like coffee grounds.  You are unable to keep fluids down.  Your abdominal pain gets worse.  You have a fever.  You do not feel better after 1 week.  You have any other questions or concerns. MAKE SURE YOU:  Understand these instructions.  Will watch your condition.  Will get help right away if you are not doing well or get worse. Document Released: 05/18/2001 Document Revised: 11/23/2011 Document Reviewed: 07/07/2011 ExitCare Patient Information 2015 [employer]. This information is not intended to replace advice given to you by your health care provider. Make sure you discuss any questions you have with your health care provider.  Food Choices for Gastroesophageal Reflux Disease When you have gastroesophageal reflux disease (GERD), the foods you eat and your eating habits are very important. Choosing the right foods can help ease the discomfort of GERD. WHAT GENERAL GUIDELINES DO I NEED TO FOLLOW?  Choose fruits, vegetables, whole grains, low-fat dairy products, and low-fat   meat, fish, and poultry.  Limit fats such as oils, salad dressings, butter, nuts, and avocado.  Keep a food diary to identify foods that cause symptoms.  Avoid foods that cause reflux. These may be different for different people.  Eat  frequent small meals instead of three large meals each day.  Eat your meals slowly, in a relaxed setting.  Limit fried foods.  Cook foods using methods other than frying.  Avoid drinking alcohol.  Avoid drinking large amounts of liquids with your meals.  Avoid bending over or lying down until 2-3 hours after eating. WHAT FOODS ARE NOT RECOMMENDED? The following are some foods and drinks that may worsen your symptoms: Vegetables Tomatoes. Tomato juice. Tomato and spaghetti sauce. Chili peppers. Onion and garlic. Horseradish. Fruits Oranges, grapefruit, and lemon (fruit and juice). Meats High-fat meats, fish, and poultry. This includes hot dogs, ribs, ham, sausage, salami, and bacon. Dairy Whole milk and chocolate milk. Sour cream. Cream. Butter. Ice cream. Cream cheese.  Beverages Coffee and tea, with or without caffeine. Carbonated beverages or energy drinks. Condiments Hot sauce. Barbecue sauce.  Sweets/Desserts Chocolate and cocoa. Donuts. Peppermint and spearmint. Fats and Oils High-fat foods, including [place] fries and potato chips. Other Vinegar. Strong spices, such as black pepper, white pepper, red pepper, cayenne, curry powder, cloves, ginger, and chili powder. The items listed above may not be a complete list of foods and beverages to avoid. Contact your dietitian for more information. Document Released: 05/24/2005 Document Revised: 05/29/2013 Document Reviewed: 03/28/2013 [facility] Patient Information 2015 [place], [place]. This information is not intended to replace advice given to you by your health care provider. Make sure you discuss any questions you have with your health care provider.   Emergency Department Resource Guide 1) Find a Doctor and Pay Out of Pocket Although you won't have to find out who is covered by your insurance plan, it is a good idea to ask around and get recommendations. You will then need to call the office and see if the doctor you have  chosen will accept you as a new patient and what types of options they offer for patients who are self-pay. Some doctors offer discounts or will set up payment plans for their patients who do not have insurance, but you will need to ask so you aren't surprised when you get to your appointment.  2) Contact Your Local Health Department Not all health departments have doctors that can see patients for sick visits, but many do, so it is worth a call to see if yours does. If you don't know where your local health department is, you can check in your phone book. The CDC also has a tool to help you locate your state's health department, and many state websites also have listings of all of their local health departments.  3) Find a Walk-in Clinic If your illness is not likely to be very severe or complicated, you may want to try a walk in clinic. These are popping up all over the country in pharmacies, drugstores, and shopping centers. They're usually staffed by nurse practitioners or physician assistants that have been trained to treat common illnesses and complaints. They're usually fairly quick and inexpensive. However, if you have serious medical issues or chronic medical problems, these are probably not your best option.  No Primary Care Doctor: - Call Health Connect at  [phone] - they can help you locate a primary care doctor that  accepts your insurance, provides certain services, etc. - Physician  Referral Service- [phone]  Chronic Pain Problems: Organization         Address  Phone   Notes  [facility]  [phone] Patients need to be referred by their primary care doctor.   Medication Assistance: Organization         Address  Phone   Notes  [facility] Medication [facility] [address] [place], [place] [address] [phone] --Must be a resident of [facility] -- Must have NO insurance coverage whatsoever (no Medicaid/ Medicare,  etc.) -- The pt. MUST have a primary care doctor that directs their care regularly and follows them in the community   [facility]ist  [phone]   [employer]  [phone]    Agencies that provide inexpensive medical care: Organization         Address  Phone   Notes  [place]  [phone]   [facility]    [phone]   [facility] [place], [place] [address] [phone]   [place] [address], [place] [phone]   Planned Parenthood    [phone]   [facility]    [phone]   [place] and [place] [address], [place] Phone:  [phone], Fax:  [phone] Hours of Operation:  9 am - 6 pm, M-F.  Also accepts Medicaid/Medicare and self-pay.  [facility] for [place], [address], [place] Phone: [phone], Fax: [phone]. Hours of Operation:  8:30 am - 5:30 pm, M-F.  Also accepts Medicaid and self-pay.  [facility] [place] [address] [place] Phone: [phone]   [place], [place], [place] [phone], Ext. [phone] Mondays & Thursdays: 7-9 AM.  First 15 patients are seen on a first come, first serve basis.    [place] Providers:  Organization         Address  Phone   Notes  [facility] [address], [place] [phone] Also accepts self-pay patients.  [facility] [address], [place]  [phone]   [place], [address], [place] [phone]   [facility] [address], [place] [phone]   Lucianne Lei [address], [place]   [phone] Only accepts [place] Access [place] patients after they have their name applied to their card.   Self-Pay (no  insurance) in [facility]:  Organization         Address  Phone   Notes  Sickle Cell Patients, [facility] [place] [phone]   [facility] Urgent Care [place] [phone]   [facility] [place]  [place], [place], [place] [phone]   [facility]/Dr. Osei-Bonsu  [address], [place] or [address], [place] [phone] Phone number for both [place] and [place] locations is the same.  [facility] [address] [place] [phone]   [facility] [address], [place] or [address] [phone]   [facility] [place], [place] [phone], phone; [phone], fax Sees patients 1st and 3rd Saturday of every month.  Must not qualify for public or private insurance (i.e. Medicaid, Medicare, [place] Health Choice, Veterans' Benefits)  Household income should be no more than 200% of the poverty level The clinic cannot treat you if you are pregnant or think you are pregnant  Sexually transmitted diseases are not treated at the clinic.    Dental Care: Organization         Address  Phone  Notes  [facility] Department of [facility] [phone] Accepts children up to age 50 who are enrolled in [place] or [place]; pregnant women with a Medicaid card; and children who have applied for Medicaid or [place] Health Choice, but were declined, whose parents can pay a reduced fee at time of service.  [facility] Department of [facility]  [address], [place] [phone] Accepts children up to age 98 who are enrolled in [place] or [place]; pregnant women with a Medicaid card; and children who have applied for Medicaid or [place] Health Choice, but were  declined, whose parents can pay a reduced fee at time of service.  [place] Adult Dental Access PROGRAM  [place] [phone] Patients are seen by appointment only. Walk-ins are not accepted. [place] will see patients 65 years of age and older. Monday - Tuesday (8am-5pm) Most Wednesdays (8:30-5pm) $30 per visit, cash only  [facility] Adult Dental Access PROGRAM  [address], [facility] [phone] Patients are seen by appointment only. Walk-ins are not accepted. [place] will see patients 53 years of age and older. One Wednesday Evening (Monthly: Volunteer Based).  $30 per visit, cash only  [place]  [phone] for adults; Children under age 51, call Graduate Pediatric Dentistry at [phone]. Children aged 7-14, please call [phone] to request a pediatric application.  Dental services are provided in all areas of dental care including fillings, crowns and bridges, complete and partial dentures, implants, gum treatment, root canals, and extractions. Preventive care is also provided. Treatment is provided to both adults and children. Patients are selected via a lottery and there is often a waiting list.   [facility] [address], [place]  [phone] [email or web address]   [facility] [address] [place], [place] [phone], Ext. [phone] Second and Fourth Thursday of each month, opens at 6:30 AM; Clinic ends at 9 AM.  Patients are seen on a first-come first-served basis, and a limited number are seen during each clinic.   [facility]  [address] Hillard Danker [place], [place] [phone]   Eligibility Requirements You must have lived in [place], [place], or [place] counties for at least the last three months.   You cannot be eligible for state or federal sponsored [employer], including [employer], [place], or [employer].   You generally cannot be  eligible for healthcare insurance through your employer.    How to apply: Eligibility screenings are held every Tuesday and Wednesday afternoon from 1:00 pm until 4:00 pm. You do not need an appointment for the interview!  [facility] [address] [place], [place]   [place]  [place] Department  [place]  [phone]    Behavioral Health Resources in the  Community: Intensive Outpatient Dispensing optician         Address  Phone  Notes  [place]. [address] [place], [place] [phone]   [facility] [address] [place], [place]   ADS: Alcohol & Drug Svcs [address] [place], [place]   [place] [address]  [place], [place] or [phone]   Substance Abuse Resources Organization         Address  Phone  Notes  Alcohol and Drug Services  [phone]   [place]  [phone]   The [place]   Chinita Pester  [phone]   Residential & Outpatient Substance Abuse Program  [phone]   Psychological Services Organization         Address  Phone  Notes  [facility] [place]  [place]  [phone]   [place] [address], [place] or [phone]    Mobile Crisis Teams Organization         Address  Phone  Notes  [facility], Mobile Crisis Care Unit  [phone]   [facility]  [address] [place], [place]   [place] [address] [place] [phone]    Self-Help/Support Groups Organization         Address  Phone             Notes  [place]. of [place] - variety of support groups  [place] Call for more information    Narcotics Anonymous (NA), [facility] [address], [employer] [place]  2 meetings at this location   Special educational needs teacher         Address  Phone  Notes  ASAP Residential Treatment [place],    [place]  [phone]   [facility]  [address], [place] [address], [place], [place]   [place], [place] [phone] Admissions: 8am-3pm M-F  Incentives Substance [facility] [address],    [place], [place] [phone]   [facility] [address] [place], [place], [place]   [facility] [address],  [place], [place]   [facility] - Intensive Outpatient [address] Kristeen [address], [place], [place]   [facility] [address]  [place], [place] [phone] or [phone]   [facility] (RTS) [address] [place], [place] Accepts Medicaid  [facility] [place] [address],  [place] [phone] Substance Abuse/Addiction Treatment   [facility] Organization         Address  Phone  Notes  [facility]  [phone]   Domenic Schwab, PhD [address] [place]lis [place], [place]   [phone] or [phone]   [facility], [place] [phone]   [place] [address] [place], [place] [phone] Insurance/Medicaid/sponsorship through [employer] and Families [address] [place]                                    [place], [place] [phone] [place] [address], [place] [phone]  Dr. Adele Schilder  [phone]   [facility] Dept. 1) [address], [place] 2) [place] 3)  [place] [address], [place] [phone]  [phone]   [place] [phone] or [phone] (After Hours)

## 2015-01-03 NOTE — ED Notes (Signed)
Patient transported to Ultrasound 

## 2015-01-03 NOTE — ED Notes (Signed)
Patient is alert and orientedx4.  Patient was explained discharge instructions and they understood them with no questions.   

## 2015-10-01 ENCOUNTER — Emergency Department (HOSPITAL_COMMUNITY): Payer: BLUE CROSS/BLUE SHIELD

## 2015-10-01 ENCOUNTER — Encounter (HOSPITAL_COMMUNITY): Payer: Self-pay | Admitting: Emergency Medicine

## 2015-10-01 ENCOUNTER — Emergency Department (HOSPITAL_COMMUNITY)
Admission: EM | Admit: 2015-10-01 | Discharge: 2015-10-01 | Disposition: A | Discharge status: Home / Self Care | Payer: BLUE CROSS/BLUE SHIELD | Attending: Emergency Medicine | Admitting: Emergency Medicine

## 2015-10-01 DIAGNOSIS — Z79899 Other long term (current) drug therapy: Secondary | ICD-10-CM | POA: Diagnosis not present

## 2015-10-01 DIAGNOSIS — R1013 Epigastric pain: Secondary | ICD-10-CM | POA: Diagnosis present

## 2015-10-01 DIAGNOSIS — Z872 Personal history of diseases of the skin and subcutaneous tissue: Secondary | ICD-10-CM | POA: Insufficient documentation

## 2015-10-01 DIAGNOSIS — K529 Noninfective gastroenteritis and colitis, unspecified: Secondary | ICD-10-CM | POA: Diagnosis not present

## 2015-10-01 DIAGNOSIS — Z3202 Encounter for pregnancy test, result negative: Secondary | ICD-10-CM | POA: Diagnosis not present

## 2015-10-01 DIAGNOSIS — J45909 Unspecified asthma, uncomplicated: Secondary | ICD-10-CM | POA: Insufficient documentation

## 2015-10-01 LAB — LIPASE, BLOOD: LIPASE: 27 U/L (ref 11–51)

## 2015-10-01 LAB — CBC WITH DIFFERENTIAL/PLATELET
BASOS PCT: 0 %
Basophils Absolute: 0 10*3/uL (ref 0.0–0.1)
Eosinophils Absolute: 0.3 10*3/uL (ref 0.0–0.7)
Eosinophils Relative: 3 %
HEMATOCRIT: 38 % (ref 36.0–46.0)
Hemoglobin: 12.9 g/dL (ref 12.0–15.0)
LYMPHS PCT: 19 %
Lymphs Abs: 2.1 10*3/uL (ref 0.7–4.0)
MCH: 26 pg (ref 26.0–34.0)
MCHC: 33.9 g/dL (ref 30.0–36.0)
MCV: 76.5 fL — AB (ref 78.0–100.0)
MONO ABS: 0.6 10*3/uL (ref 0.1–1.0)
MONOS PCT: 5 %
Neutro Abs: 8.3 10*3/uL — ABNORMAL HIGH (ref 1.7–7.7)
Neutrophils Relative %: 73 %
Platelets: 345 10*3/uL (ref 150–400)
RBC: 4.97 MIL/uL (ref 3.87–5.11)
RDW: 14.3 % (ref 11.5–15.5)
WBC: 11.3 10*3/uL — ABNORMAL HIGH (ref 4.0–10.5)

## 2015-10-01 LAB — COMPREHENSIVE METABOLIC PANEL
ALT: 12 U/L — ABNORMAL LOW (ref 14–54)
ANION GAP: 8 (ref 5–15)
AST: 19 U/L (ref 15–41)
Albumin: 4.5 g/dL (ref 3.5–5.0)
Alkaline Phosphatase: 50 U/L (ref 38–126)
BILIRUBIN TOTAL: 0.8 mg/dL (ref 0.3–1.2)
BUN: 11 mg/dL (ref 6–20)
CALCIUM: 9.5 mg/dL (ref 8.9–10.3)
CO2: 24 mmol/L (ref 22–32)
Chloride: 105 mmol/L (ref 101–111)
Creatinine, Ser: 0.84 mg/dL (ref 0.44–1.00)
GFR calc Af Amer: >60 mL/min (ref >60)
Glucose, Bld: 112 mg/dL — ABNORMAL HIGH (ref 65–99)
Potassium: 3.8 mmol/L (ref 3.5–5.1)
Sodium: 137 mmol/L (ref 135–145)
TOTAL PROTEIN: 8.5 g/dL — AB (ref 6.5–8.1)

## 2015-10-01 LAB — URINALYSIS, ROUTINE W REFLEX MICROSCOPIC
BILIRUBIN URINE: NEGATIVE
Glucose, UA: NEGATIVE mg/dL
Hgb urine dipstick: NEGATIVE
KETONES UR: NEGATIVE mg/dL
LEUKOCYTES UA: NEGATIVE
NITRITE: NEGATIVE
PH: 6 (ref 5.0–8.0)
PROTEIN: NEGATIVE mg/dL
Specific Gravity, Urine: 1.017 (ref 1.005–1.030)

## 2015-10-01 LAB — POC URINE PREG, ED: Preg Test, Ur: NEGATIVE

## 2015-10-01 MED ORDER — HYDROMORPHONE HCL 1 MG/ML IJ SOLN
1.0000 mg | Freq: Once | INTRAMUSCULAR | Status: AC
  Administered 2015-10-01: 1 mg via INTRAVENOUS
  Filled 2015-10-01: qty 1

## 2015-10-01 MED ORDER — IOPAMIDOL (ISOVUE-300) INJECTION 61%
100.0000 mL | Freq: Once | INTRAVENOUS | Status: AC | PRN
  Administered 2015-10-01: 100 mL via INTRAVENOUS

## 2015-10-01 MED ORDER — SODIUM CHLORIDE 0.9 % IV BOLUS (SEPSIS)
1000.0000 mL | Freq: Once | INTRAVENOUS | Status: AC
  Administered 2015-10-01: 1000 mL via INTRAVENOUS

## 2015-10-01 MED ORDER — FAMOTIDINE IN NACL 20-0.9 MG/50ML-% IV SOLN
20.0000 mg | Freq: Once | INTRAVENOUS | Status: AC
  Administered 2015-10-01: 20 mg via INTRAVENOUS
  Filled 2015-10-01: qty 50

## 2015-10-01 MED ORDER — HYDROCODONE-ACETAMINOPHEN 5-325 MG PO TABS
1.0000 | ORAL_TABLET | Freq: Once | ORAL | Status: AC
Start: 2015-10-01 — End: 2015-10-01
  Administered 2015-10-01: 1 via ORAL
  Filled 2015-10-01: qty 1

## 2015-10-01 MED ORDER — ONDANSETRON HCL 4 MG/2ML IJ SOLN
4.0000 mg | Freq: Once | INTRAMUSCULAR | Status: AC
  Administered 2015-10-01: 4 mg via INTRAVENOUS
  Filled 2015-10-01: qty 2

## 2015-10-01 MED ORDER — MORPHINE SULFATE (PF) 4 MG/ML IV SOLN
6.0000 mg | Freq: Once | INTRAVENOUS | Status: AC
  Administered 2015-10-01: 6 mg via INTRAVENOUS
  Filled 2015-10-01: qty 2

## 2015-10-01 MED ORDER — HYDROCODONE-ACETAMINOPHEN 5-325 MG PO TABS: 1.0000 | ORAL_TABLET | Freq: Four times a day (QID) | ORAL | Status: DC | PRN

## 2015-10-01 MED ORDER — CIPROFLOXACIN HCL 500 MG PO TABS
500.0000 mg | ORAL_TABLET | Freq: Once | ORAL | Status: AC
  Administered 2015-10-01: 500 mg via ORAL
  Filled 2015-10-01: qty 1

## 2015-10-01 MED ORDER — GI COCKTAIL ~~LOC~~
30.0000 mL | Freq: Once | ORAL | Status: AC
  Administered 2015-10-01: 30 mL via ORAL
  Filled 2015-10-01: qty 30

## 2015-10-01 MED ORDER — DIATRIZOATE MEGLUMINE & SODIUM 66-10 % PO SOLN
30.0000 mL | Freq: Once | ORAL | Status: AC
  Administered 2015-10-01: 30 mL via ORAL

## 2015-10-01 MED ORDER — CIPROFLOXACIN HCL 500 MG PO TABS: 500.0000 mg | ORAL_TABLET | Freq: Two times a day (BID) | ORAL | Status: AC

## 2015-10-01 NOTE — ED Notes (Signed)
Per EMS pt reports NVD since last night. Epigastric pain, received 4 mg Zofran IV by EMS. Alert and oriented x 4.
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
## 2015-10-01 NOTE — ED Notes (Signed)
Pt. Went to the restroom to urinate for urine specimen. Pt. Stated that she did not want to have a in and out cath done. Nurse was notified.

## 2015-10-01 NOTE — ED Notes (Signed)
Bed: ZO10WA03 Expected date:  Expected time:  Means of arrival:  Comments: EMS- 24yo F, n/v/d

## 2015-10-01 NOTE — ED Notes (Signed)
Patient given turkey sandwich

## 2015-10-01 NOTE — ED Notes (Signed)
MD at bedside. 

## 2015-10-03 ENCOUNTER — Encounter: Payer: Self-pay | Admitting: Emergency Medicine

## 2015-10-03 ENCOUNTER — Emergency Department (INDEPENDENT_AMBULATORY_CARE_PROVIDER_SITE_OTHER)
Admission: EM | Admit: 2015-10-03 | Discharge: 2015-10-03 | Disposition: A | Discharge status: Home / Self Care | Payer: BLUE CROSS/BLUE SHIELD | Source: Home / Self Care | Attending: Family Medicine | Admitting: Family Medicine

## 2015-10-03 DIAGNOSIS — K5289 Other specified noninfective gastroenteritis and colitis: Secondary | ICD-10-CM | POA: Diagnosis not present

## 2015-10-03 DIAGNOSIS — R1084 Generalized abdominal pain: Secondary | ICD-10-CM

## 2015-10-03 MED ORDER — HYDROCODONE-ACETAMINOPHEN 5-325 MG PO TABS: 1.0000 | ORAL_TABLET | Freq: Four times a day (QID) | ORAL | Status: DC | PRN

## 2015-10-03 MED ORDER — PREDNISONE 20 MG PO TABS: ORAL_TABLET | ORAL | Status: DC

## 2015-10-03 NOTE — ED Notes (Signed)
Reports diarrhea today with burning and cramping of abdomen; no pain med since yesterday; taking Cipro as ordered.
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
## 2015-10-03 NOTE — ED Notes (Signed)
Patient states she was diagnosed with Crohn's disease in ER on 10/01/15; was given pain meds which are now gone; cannot be seen by gastroenterologist until 11/27/15.

## 2015-10-03 NOTE — Discharge Instructions (Signed)
May take Imodium (loperamide) as needed for diarrhea and loose stools.   Colitis Colitis is inflammation of the colon. Colitis may last a short time (acute) or it may last a long time (chronic). CAUSES This condition may be caused by:  Viruses.  Bacteria.  Reactions to medicine.  Certain autoimmune diseases, such as Crohn disease or ulcerative colitis. SYMPTOMS Symptoms of this condition include:  Diarrhea.  Passing bloody or tarry stool.  Pain.  Fever.  Vomiting.  Tiredness (fatigue).  Weight loss.  Bloating.  Sudden increase in abdominal pain.  Having fewer bowel movements than usual. DIAGNOSIS This condition is diagnosed with a stool test or a blood test. You may also have other tests, including X-rays, a CT scan, or a colonoscopy. TREATMENT Treatment may include:  Resting the bowel. This involves not eating or drinking for a period of time.  Fluids that are given through an IV tube.  Medicine for pain and diarrhea.  Antibiotic medicines.  Cortisone medicines.  Surgery. HOME CARE INSTRUCTIONS Eating and Drinking  Follow instructions from your health care provider about eating or drinking restrictions.  Drink enough fluid to keep your urine clear or pale yellow.  Work with a dietitian to determine which foods cause your condition to flare up.  Avoid foods that cause flare-ups.  Eat a well-balanced diet. Medicines  Take over-the-counter and prescription medicines only as told by your health care provider.  If you were prescribed an antibiotic medicine, take it as told by your health care provider. Do not stop taking the antibiotic even if you start to feel better. General Instructions  Keep all follow-up visits as told by your health care provider. This is important. SEEK MEDICAL CARE IF:  Your symptoms do not go away.  You develop new symptoms. SEEK IMMEDIATE MEDICAL CARE IF:  You have a fever that does not go away with  treatment.  You develop chills.  You have extreme weakness, fainting, or dehydration.  You have repeated vomiting.  You develop severe pain in your abdomen.  You pass bloody or tarry stool.   This information is not intended to replace advice given to you by your health care provider. Make sure you discuss any questions you have with your health care provider.   Document Released: 07/01/2004 Document Revised: 02/12/2015 Document Reviewed: 09/16/2014 Elsevier Interactive Patient Education [employer]2016 Elsevier Inc.

## 2015-10-06 ENCOUNTER — Ambulatory Visit: Payer: BLUE CROSS/BLUE SHIELD | Admitting: Family Medicine

## 2015-10-07 ENCOUNTER — Ambulatory Visit: Payer: BC Managed Care – PPO | Admitting: Family Medicine

## 2015-10-07 ENCOUNTER — Ambulatory Visit (INDEPENDENT_AMBULATORY_CARE_PROVIDER_SITE_OTHER): Payer: BLUE CROSS/BLUE SHIELD | Admitting: Family Medicine

## 2015-10-07 ENCOUNTER — Encounter: Payer: Self-pay | Admitting: Family Medicine

## 2015-10-07 VITALS — BP 102/69 | HR 88 | Wt 147.0 lb

## 2015-10-07 DIAGNOSIS — K529 Noninfective gastroenteritis and colitis, unspecified: Secondary | ICD-10-CM

## 2015-10-07 MED ORDER — ONDANSETRON HCL 8 MG PO TABS: 8.0000 mg | ORAL_TABLET | Freq: Three times a day (TID) | ORAL | Status: DC | PRN

## 2015-10-07 NOTE — Patient Instructions (Signed)
Thank you for coming in today. We will do stool studies.  You have an appointment TODAY at [facility] at [facility]   Address: [address], [place], [place] [address] Phone: [phone]  Get a CD made of your CT scan.   Return a few weeks after your colonoscopy.
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
## 2015-10-20 ENCOUNTER — Ambulatory Visit: Payer: BC Managed Care – PPO | Admitting: Family Medicine

## 2015-10-31 ENCOUNTER — Encounter: Payer: Self-pay | Admitting: Family Medicine

## 2016-01-18 ENCOUNTER — Encounter (HOSPITAL_COMMUNITY): Payer: Self-pay | Admitting: Emergency Medicine

## 2016-01-18 ENCOUNTER — Ambulatory Visit (HOSPITAL_COMMUNITY)
Admission: EM | Admit: 2016-01-18 | Discharge: 2016-01-18 | Disposition: A | Discharge status: Home / Self Care | Payer: BLUE CROSS/BLUE SHIELD | Attending: Family Medicine | Admitting: Family Medicine

## 2016-01-18 DIAGNOSIS — F17210 Nicotine dependence, cigarettes, uncomplicated: Secondary | ICD-10-CM | POA: Diagnosis not present

## 2016-01-18 DIAGNOSIS — J45909 Unspecified asthma, uncomplicated: Secondary | ICD-10-CM | POA: Insufficient documentation

## 2016-01-18 DIAGNOSIS — K589 Irritable bowel syndrome without diarrhea: Secondary | ICD-10-CM | POA: Insufficient documentation

## 2016-01-18 DIAGNOSIS — R110 Nausea: Secondary | ICD-10-CM | POA: Diagnosis not present

## 2016-01-18 DIAGNOSIS — R1013 Epigastric pain: Secondary | ICD-10-CM | POA: Diagnosis not present

## 2016-01-18 DIAGNOSIS — R109 Unspecified abdominal pain: Secondary | ICD-10-CM | POA: Diagnosis present

## 2016-01-18 LAB — COMPREHENSIVE METABOLIC PANEL
ALBUMIN: 4.3 g/dL (ref 3.5–5.0)
ALT: 9 U/L — ABNORMAL LOW (ref 14–54)
ANION GAP: 8 (ref 5–15)
AST: 15 U/L (ref 15–41)
Alkaline Phosphatase: 45 U/L (ref 38–126)
BUN: 6 mg/dL (ref 6–20)
CALCIUM: 9.7 mg/dL (ref 8.9–10.3)
CHLORIDE: 103 mmol/L (ref 101–111)
CO2: 25 mmol/L (ref 22–32)
Creatinine, Ser: 0.83 mg/dL (ref 0.44–1.00)
GFR calc non Af Amer: >60 mL/min (ref >60)
GLUCOSE: 88 mg/dL (ref 65–99)
POTASSIUM: 3.6 mmol/L (ref 3.5–5.1)
SODIUM: 136 mmol/L (ref 135–145)
Total Bilirubin: 0.9 mg/dL (ref 0.3–1.2)
Total Protein: 7.8 g/dL (ref 6.5–8.1)

## 2016-01-18 LAB — CBC WITH DIFFERENTIAL/PLATELET
BASOS PCT: 0 %
Basophils Absolute: 0 10*3/uL (ref 0.0–0.1)
Eosinophils Absolute: 0.4 10*3/uL (ref 0.0–0.7)
Eosinophils Relative: 4 %
HEMATOCRIT: 38.9 % (ref 36.0–46.0)
HEMOGLOBIN: 12.5 g/dL (ref 12.0–15.0)
LYMPHS ABS: 3.3 10*3/uL (ref 0.7–4.0)
LYMPHS PCT: 34 %
MCH: 25.6 pg — ABNORMAL LOW (ref 26.0–34.0)
MCHC: 32.1 g/dL (ref 30.0–36.0)
MCV: 79.7 fL (ref 78.0–100.0)
MONOS PCT: 5 %
Monocytes Absolute: 0.5 10*3/uL (ref 0.1–1.0)
NEUTROS ABS: 5.5 10*3/uL (ref 1.7–7.7)
NEUTROS PCT: 57 %
Platelets: 313 10*3/uL (ref 150–400)
RBC: 4.88 MIL/uL (ref 3.87–5.11)
RDW: 14.4 % (ref 11.5–15.5)
WBC: 9.7 10*3/uL (ref 4.0–10.5)

## 2016-01-18 LAB — LIPASE, BLOOD: Lipase: 29 U/L (ref 11–51)

## 2016-01-18 MED ORDER — TRAMADOL HCL 50 MG PO TABS: 50.0000 mg | ORAL_TABLET | Freq: Four times a day (QID) | ORAL | 0 refills | Status: DC | PRN

## 2016-01-18 MED ORDER — PROMETHAZINE HCL 25 MG PO TABS: 25.0000 mg | ORAL_TABLET | Freq: Four times a day (QID) | ORAL | 0 refills | Status: DC | PRN

## 2016-01-18 NOTE — ED Triage Notes (Signed)
PT reports abdominal pain and bloating for 1 week. PT reports she is nauseated and has a poor appetite. PT reports diarrhea since Monday with about 3 episodes per day.
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
## 2016-01-20 ENCOUNTER — Encounter: Payer: Self-pay | Admitting: Family Medicine

## 2016-01-20 ENCOUNTER — Ambulatory Visit (HOSPITAL_BASED_OUTPATIENT_CLINIC_OR_DEPARTMENT_OTHER): Payer: BLUE CROSS/BLUE SHIELD

## 2016-01-20 ENCOUNTER — Ambulatory Visit (INDEPENDENT_AMBULATORY_CARE_PROVIDER_SITE_OTHER): Payer: BLUE CROSS/BLUE SHIELD | Admitting: Family Medicine

## 2016-01-20 DIAGNOSIS — R109 Unspecified abdominal pain: Secondary | ICD-10-CM | POA: Insufficient documentation

## 2016-01-20 DIAGNOSIS — R1013 Epigastric pain: Secondary | ICD-10-CM | POA: Diagnosis not present

## 2016-01-20 MED ORDER — ELUXADOLINE 100 MG PO TABS: 100.0000 mg | ORAL_TABLET | Freq: Two times a day (BID) | ORAL | 3 refills | Status: AC

## 2016-01-20 MED ORDER — PROMETHAZINE HCL 25 MG PO TABS: 25.0000 mg | ORAL_TABLET | Freq: Four times a day (QID) | ORAL | 1 refills | Status: DC | PRN

## 2016-01-20 MED ORDER — HYDROCODONE-ACETAMINOPHEN 5-325 MG PO TABS: 1.0000 | ORAL_TABLET | Freq: Four times a day (QID) | ORAL | 0 refills | Status: DC | PRN

## 2016-01-20 NOTE — Patient Instructions (Signed)
Thank you for coming in today. Get ultrasound today at 230pm at the [facility]  I will contact you with results today or tomorrow.  Start viberzi.  Use norco very sparingly.  Return Friday for recheck.  If your belly pain worsens, or you have high fever, bad vomiting, blood in your stool or black tarry stool go to the Emergency Room.    Eluxadoline oral tablets What is this medicine? ELUXADOLINE (ee LUX ah dol ine) is an intestinal disorder drug. It is used to treat irritable bowel syndrome with diarrhea. This medicine may be used for other purposes; ask your health care provider or pharmacist if you have questions. What should I tell my health care provider before I take this medicine? They need to know if you have any of these conditions: -drink more than 3 alcohol-containing drinks per day -gallbladder disease -history of drug or alcohol abuse problems -history of pancreatitis or pancreatic disease -liver disease -stomach or intestine problems -an unusual or allergic reaction to eluxadoline, other medicines, foods, dyes, or preservatives -pregnant or trying to get pregnant -breast-feeding How should I use this medicine? Take this medicine by mouth with a glass of water. Follow the directions on the prescription label. Take this medicine with food. Take your medicine at regular intervals. Do not take it more often than directed. Do not stop taking except on your doctor's advice. Talk to your pediatrician regarding the use of this medicine in children. Special care may be needed. Overdosage: If you think you have taken too much of this medicine contact a poison control center or emergency room at once. NOTE: This medicine is only for you. Do not share this medicine with others. What if I miss a dose? If you miss a dose, take it as soon as you can. If it is almost time for your next dose, take only that dose. Do not take double or extra doses. What may interact with this  medicine? This medicine may interact with the following medications: -alosetron -anticholinergics -bupropion -certain antibiotics like ciprofloxacin, clarithromycin, rifampin -cyclosporine -eltrombopag -ergot alkaloids like dihydroergotamine, ergonovine, ergotamine, methylergonovine -fluconazole -gemfibrozil -loperamide -opiate agonist -paroxetine -pimozide -probenecid -quinidine -rosuvastatin -sirolimus -tacrolimus This list may not describe all possible interactions. Give your health care provider a list of all the medicines, herbs, non-prescription drugs, or dietary supplements you use. Also tell them if you smoke, drink alcohol, or use illegal drugs. Some items may interact with your medicine. What should I watch for while using this medicine? Tell your doctor or healthcare professional if your symptoms do not start to get better or if they get worse. This medicine may cause constipation. If you do not have a bowel movement, call your doctor or healthcare professional. You may get drowsy or dizzy. Do not drive, use machinery, or do anything that needs mental alertness until you know how this medicine affects you. Do not stand or sit up quickly, especially if you are an older patient. This reduces the risk of dizzy or fainting spells. Alcohol may interfere with the effect of this medicine. Avoid alcoholic drinks. What side effects may I notice from receiving this medicine? Side effects that you should report to your doctor or health care professional as soon as possible: -allergic reactions like skin rash, itching or hives, swelling of the face, lips, or tongue -breathing problems -constipation -stomach pain Side effects that usually do not require medical attention (Report these to your doctor or health care professional if they continue or are  bothersome.): -dizziness -nausea, vomiting -tiredness This list may not describe all possible side effects. Call your doctor for  medical advice about side effects. You may report side effects to FDA at 1-800-FDA-1088. Where should I keep my medicine? Keep out of the reach of children. Store at room temperature between 20 and 25 degrees C (68 and 77 degrees F). Throw away any unused medicine after the expiration date. NOTE: This sheet is a summary. It may not cover all possible information. If you have questions about this medicine, talk to your doctor, pharmacist, or health care provider.    2016, Elsevier/Gold Standard. (2013-11-07 14:54:26)

## 2016-01-20 NOTE — Progress Notes (Signed)
Pt here for IBS follow up following 8/13 visit to UC.  Nausea med has helped, tramadol takes the edge off the pain, but still having cramping and burning.  Still having loose stools and food makes cramping worse.
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
## 2016-01-23 ENCOUNTER — Ambulatory Visit: Payer: BLUE CROSS/BLUE SHIELD | Admitting: Family Medicine

## 2016-01-23 ENCOUNTER — Ambulatory Visit (HOSPITAL_BASED_OUTPATIENT_CLINIC_OR_DEPARTMENT_OTHER): Payer: BLUE CROSS/BLUE SHIELD

## 2016-04-14 ENCOUNTER — Encounter (HOSPITAL_COMMUNITY): Payer: Self-pay

## 2016-04-14 ENCOUNTER — Emergency Department (HOSPITAL_COMMUNITY)
Admission: EM | Admit: 2016-04-14 | Discharge: 2016-04-14 | Disposition: A | Discharge status: Home / Self Care | Payer: Self-pay | Attending: Emergency Medicine | Admitting: Emergency Medicine

## 2016-04-14 ENCOUNTER — Emergency Department (HOSPITAL_COMMUNITY): Payer: Self-pay

## 2016-04-14 DIAGNOSIS — Z79899 Other long term (current) drug therapy: Secondary | ICD-10-CM | POA: Insufficient documentation

## 2016-04-14 DIAGNOSIS — F17200 Nicotine dependence, unspecified, uncomplicated: Secondary | ICD-10-CM | POA: Insufficient documentation

## 2016-04-14 DIAGNOSIS — J45909 Unspecified asthma, uncomplicated: Secondary | ICD-10-CM | POA: Insufficient documentation

## 2016-04-14 DIAGNOSIS — R1013 Epigastric pain: Secondary | ICD-10-CM

## 2016-04-14 DIAGNOSIS — R1011 Right upper quadrant pain: Secondary | ICD-10-CM

## 2016-04-14 LAB — URINALYSIS, ROUTINE W REFLEX MICROSCOPIC
BILIRUBIN URINE: NEGATIVE
GLUCOSE, UA: NEGATIVE mg/dL
HGB URINE DIPSTICK: NEGATIVE
Ketones, ur: NEGATIVE mg/dL
Nitrite: NEGATIVE
PH: 7 (ref 5.0–8.0)
Protein, ur: NEGATIVE mg/dL
SPECIFIC GRAVITY, URINE: 1.025 (ref 1.005–1.030)

## 2016-04-14 LAB — COMPREHENSIVE METABOLIC PANEL
ALBUMIN: 4 g/dL (ref 3.5–5.0)
ALK PHOS: 52 U/L (ref 38–126)
ALT: 10 U/L — ABNORMAL LOW (ref 14–54)
AST: 17 U/L (ref 15–41)
Anion gap: 7 (ref 5–15)
BUN: 8 mg/dL (ref 6–20)
CALCIUM: 9.7 mg/dL (ref 8.9–10.3)
CO2: 24 mmol/L (ref 22–32)
Chloride: 105 mmol/L (ref 101–111)
Creatinine, Ser: 0.83 mg/dL (ref 0.44–1.00)
GFR calc Af Amer: >60 mL/min (ref >60)
GFR calc non Af Amer: >60 mL/min (ref >60)
GLUCOSE: 90 mg/dL (ref 65–99)
POTASSIUM: 4.3 mmol/L (ref 3.5–5.1)
SODIUM: 136 mmol/L (ref 135–145)
Total Bilirubin: 0.6 mg/dL (ref 0.3–1.2)
Total Protein: 7.7 g/dL (ref 6.5–8.1)

## 2016-04-14 LAB — URINE MICROSCOPIC-ADD ON: RBC / HPF: NONE SEEN RBC/hpf (ref 0–5)

## 2016-04-14 LAB — CBC
HEMATOCRIT: 38.3 % (ref 36.0–46.0)
Hemoglobin: 12.5 g/dL (ref 12.0–15.0)
MCH: 25.7 pg — AB (ref 26.0–34.0)
MCHC: 32.6 g/dL (ref 30.0–36.0)
MCV: 78.8 fL (ref 78.0–100.0)
Platelets: 362 10*3/uL (ref 150–400)
RBC: 4.86 MIL/uL (ref 3.87–5.11)
RDW: 14.3 % (ref 11.5–15.5)
WBC: 12.4 10*3/uL — ABNORMAL HIGH (ref 4.0–10.5)

## 2016-04-14 LAB — I-STAT BETA HCG BLOOD, ED (MC, WL, AP ONLY): I-stat hCG, quantitative: <5 m[IU]/mL (ref <5)

## 2016-04-14 LAB — LIPASE, BLOOD: Lipase: 41 U/L (ref 11–51)

## 2016-04-14 MED ORDER — METOCLOPRAMIDE HCL 5 MG/ML IJ SOLN
10.0000 mg | Freq: Once | INTRAMUSCULAR | Status: AC
  Administered 2016-04-14: 10 mg via INTRAVENOUS
  Filled 2016-04-14: qty 2

## 2016-04-14 MED ORDER — MORPHINE SULFATE (PF) 4 MG/ML IV SOLN
4.0000 mg | Freq: Once | INTRAVENOUS | Status: AC
  Administered 2016-04-14: 4 mg via INTRAVENOUS
  Filled 2016-04-14: qty 1

## 2016-04-14 MED ORDER — ONDANSETRON HCL 4 MG/2ML IJ SOLN: 4.0000 mg | Freq: Once | INTRAMUSCULAR | Status: DC

## 2016-04-14 MED ORDER — DIPHENHYDRAMINE HCL 50 MG/ML IJ SOLN
25.0000 mg | Freq: Once | INTRAMUSCULAR | Status: AC
  Administered 2016-04-14: 25 mg via INTRAVENOUS
  Filled 2016-04-14: qty 1

## 2016-04-14 MED ORDER — SODIUM CHLORIDE 0.9 % IV BOLUS (SEPSIS): 1000.0000 mL | Freq: Once | INTRAVENOUS | Status: DC

## 2016-04-14 MED ORDER — GI COCKTAIL ~~LOC~~
30.0000 mL | Freq: Once | ORAL | Status: AC
  Administered 2016-04-14: 30 mL via ORAL
  Filled 2016-04-14: qty 30

## 2016-04-14 MED ORDER — METOCLOPRAMIDE HCL 10 MG PO TABS: 10.0000 mg | ORAL_TABLET | Freq: Four times a day (QID) | ORAL | 0 refills | Status: DC | PRN

## 2016-04-14 MED ORDER — HYDROCODONE-ACETAMINOPHEN 5-325 MG PO TABS: 1.0000 | ORAL_TABLET | Freq: Four times a day (QID) | ORAL | 0 refills | Status: DC | PRN

## 2016-04-14 MED ORDER — PROMETHAZINE HCL 25 MG RE SUPP: 25.0000 mg | Freq: Three times a day (TID) | RECTAL | 0 refills | Status: AC | PRN

## 2016-04-14 NOTE — ED Notes (Signed)
Transported to u/s

## 2016-04-14 NOTE — ED Triage Notes (Signed)
Patient hre with 2 days of abdominal bloating with RUQ pain radiating to back. States that her pain is worse with intake.

## 2016-04-14 NOTE — ED Notes (Signed)
Per Dr., gave Pt water. Pt tolerated water.

## 2016-04-14 NOTE — ED Notes (Signed)
Patient c./o right upper abd. Pain onset Monday c/o abd. Bloating. C/op nausea no vomiting
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
## 2016-04-19 ENCOUNTER — Telehealth (HOSPITAL_BASED_OUTPATIENT_CLINIC_OR_DEPARTMENT_OTHER): Payer: Self-pay | Admitting: Emergency Medicine

## 2016-04-20 ENCOUNTER — Ambulatory Visit (INDEPENDENT_AMBULATORY_CARE_PROVIDER_SITE_OTHER): Payer: BLUE CROSS/BLUE SHIELD | Admitting: Family Medicine

## 2016-04-20 VITALS — BP 118/70 | HR 92 | Wt 149.0 lb

## 2016-04-20 DIAGNOSIS — R1013 Epigastric pain: Secondary | ICD-10-CM

## 2016-04-20 MED ORDER — METOCLOPRAMIDE HCL 10 MG PO TABS: 10.0000 mg | ORAL_TABLET | Freq: Four times a day (QID) | ORAL | 0 refills | Status: AC | PRN

## 2016-04-20 MED ORDER — HYDROCODONE-ACETAMINOPHEN 5-325 MG PO TABS: 1.0000 | ORAL_TABLET | Freq: Four times a day (QID) | ORAL | 0 refills | Status: AC | PRN
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
## 2016-04-20 NOTE — Patient Instructions (Addendum)
Thank you for coming in today. Continue the reglan.  Use norco sparingly.  You should hear about the HIDA scan soon.  Return a few days after the scan to go over results.  If your belly pain worsens, or you have high fever, bad vomiting, blood in your stool or black tarry stool go to the Emergency Room.    Gallbladder Nuclear Scan Introduction A gallbladder nuclear scan (hepatobiliary scan or HIDA scan) is an imaging test that checks the function of your liver, your gallbladder, and the ducts of those organs. These parts make up your hepatobiliary system. You may need this scan if you have symptoms of liver or gallbladder disease. This scan is done with a camera that detects radioactive energy (gamma rays). For this exam, you will be given a radioactive substance, called a radiotracer, through an IV tube inserted in your hand or arm. As the radiotracer moves through your hepatobiliary system, the camera and a computer detect the gamma rays and form them into images that show how well your system is working. Tell a health care provider about:  Any possibility of pregnancy or if you are breastfeeding.  Any allergies you have.  All medicines you are taking, including vitamins, herbs, eye drops, creams, and over-the-counter medicines.  Any problems you or family members have had with anesthetic medicines.  Any blood disorders you have.  Any surgeries you have had.  Any medical conditions you have. What happens before the procedure?  Take medicines only as directed by your health care provider.  Your health care provider will let you know when you should start fasting. You may not be able to eat or drink anything after midnight on the night before the procedure or for at least four hours before the test.  Do not wear jewelry to the exam.  Wear loose, comfortable clothing. You may be asked to put on a gown for the procedure. What happens during the procedure?  An IV tube will be  inserted into a vein in your hand or arm. It will remain in place throughout the exam.  A small amount of the radiotracer material will be injected through your IV tube.  You may feel a cold sensation as the material runs through your IV tube.  While you are lying down, a technician will place the gamma camera over your abdomen.  The camera may rotate around your body. You may be asked to stay still or move into a certain position.  You will then be given a medicine called cholecystokinin (CCK) through your IV tube. This will make your gallbladder empty. You may feel nauseous or have cramping for a short time.  Additional images will be taken after CCK is given.  After all the images have been taken, your IV tube will be removed. What happens after the procedure?  You may resume normal activities and diet as directed by your health care provider.  The radiotracer will leave your body over the next few days. There are no long-term side effects from the radiotracer. Drink lots of fluids to help flush it out of your body.  A health care provider who specializes in nuclear medicine will read the scan and send a report to your regular health care provider. This information is not intended to replace advice given to you by your health care provider. Make sure you discuss any questions you have with your health care provider. Document Released: 05/21/2000 Document Revised: 10/30/2015 Document Reviewed: 09/26/2013  2017 [employer]

## 2016-04-27 ENCOUNTER — Ambulatory Visit (HOSPITAL_COMMUNITY)
Admission: RE | Admit: 2016-04-27 | Discharge: 2016-04-27 | Disposition: A | Discharge status: Home / Self Care | Payer: Self-pay | Source: Ambulatory Visit | Attending: Family Medicine | Admitting: Family Medicine

## 2016-04-27 DIAGNOSIS — R1013 Epigastric pain: Secondary | ICD-10-CM | POA: Insufficient documentation

## 2016-04-27 MED ORDER — TECHNETIUM TC 99M MEBROFENIN IV KIT
5.0000 | PACK | Freq: Once | INTRAVENOUS | Status: AC | PRN
  Administered 2016-04-27: 5 via INTRAVENOUS

## 2016-05-03 ENCOUNTER — Ambulatory Visit: Payer: Self-pay | Admitting: Family Medicine

## 2016-07-14 ENCOUNTER — Telehealth: Payer: Self-pay | Admitting: Family Medicine

## 2016-07-14 NOTE — Telephone Encounter (Signed)
Called pt lvm about flu shot 07/14/16 @ 10:19am

## 2016-12-24 IMAGING — US US ABDOMEN LIMITED
1 series · 14 of 25 positions shown · non-contrast
Comparison: None.

CLINICAL DATA: RIGHT upper quadrant

EXAM:
US ABDOMEN LIMITED - RIGHT UPPER QUADRANT

[Series 1: us abdomen limited · 0.19mm/px · 14 of 36 slices shown]
[im 1/36]
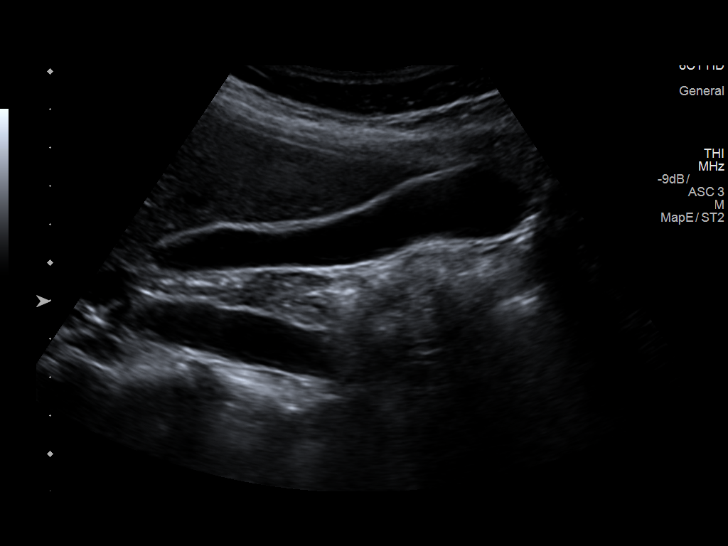
[im 3/36]
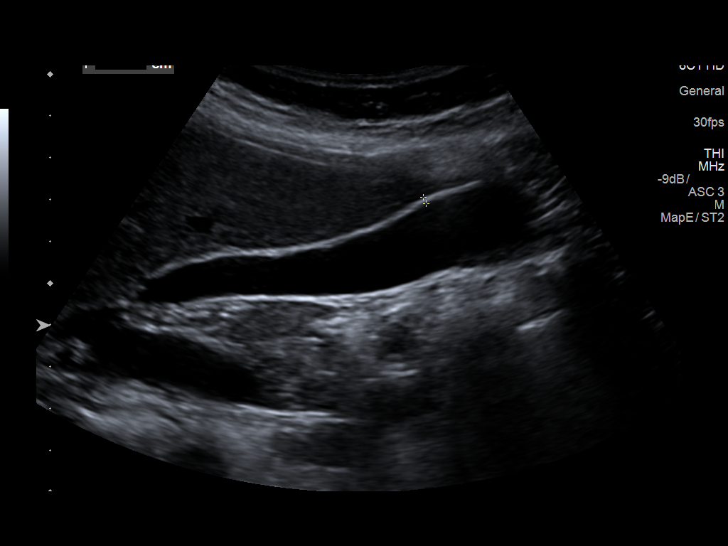
[im 6/36]
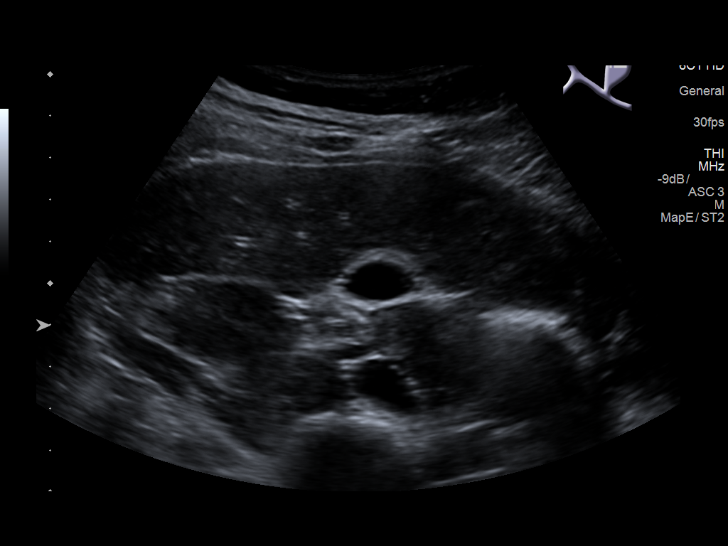
[im 9/36]
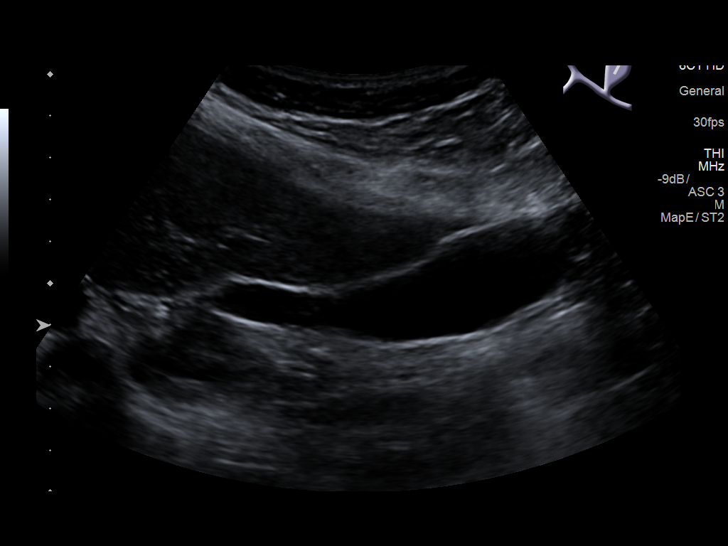
[im 12/36]
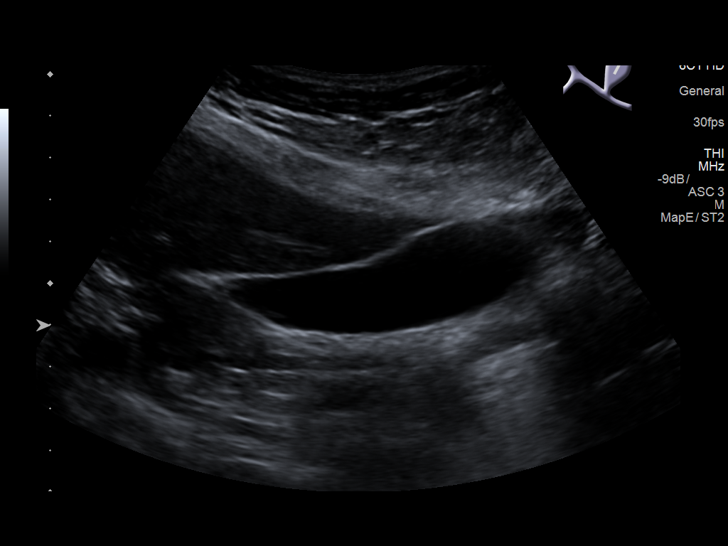
[im 14/36]
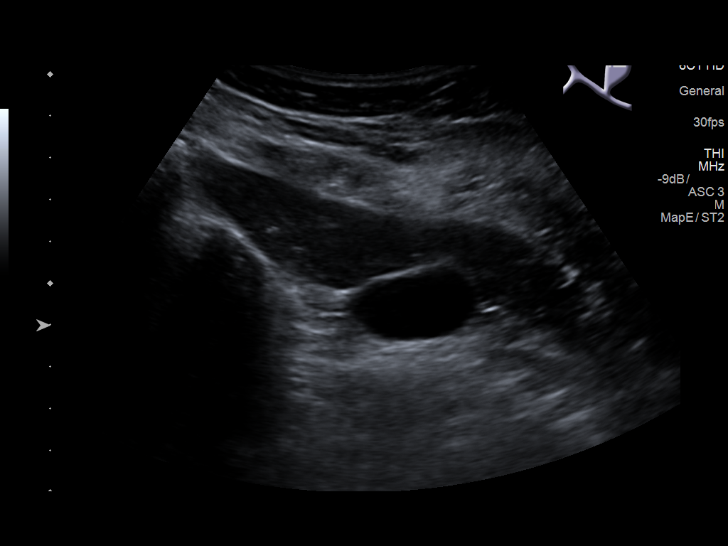
[im 17/36]
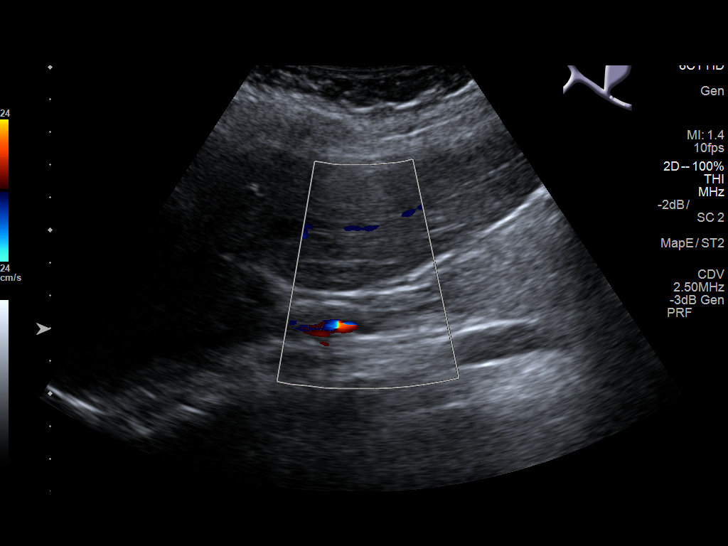
[im 19/36]
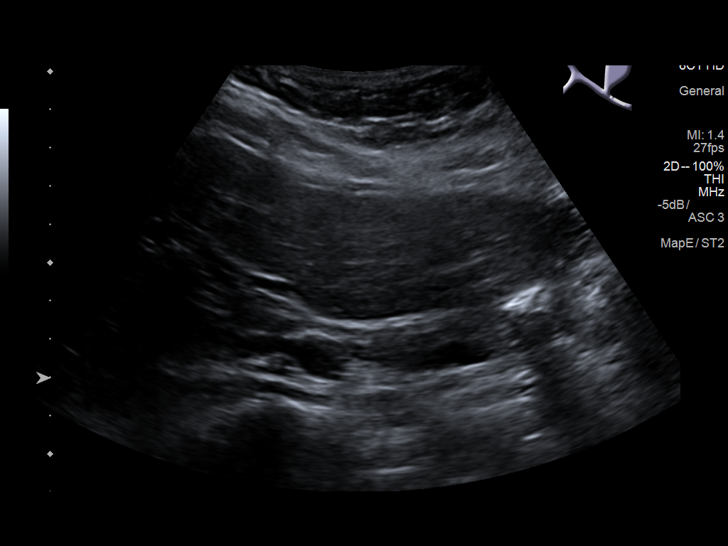
[im 22/36]
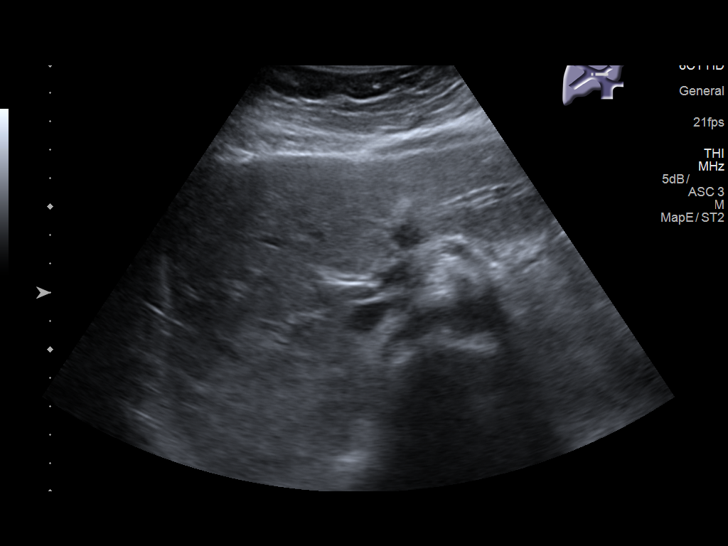
[im 24/36]
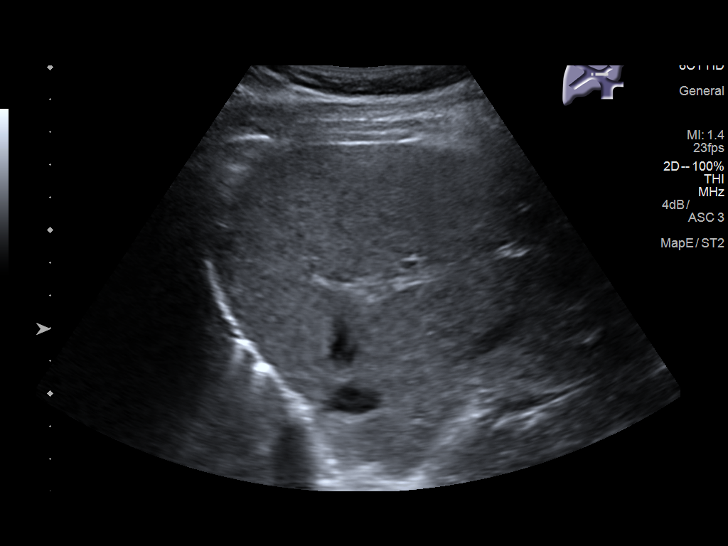
[im 27/36]
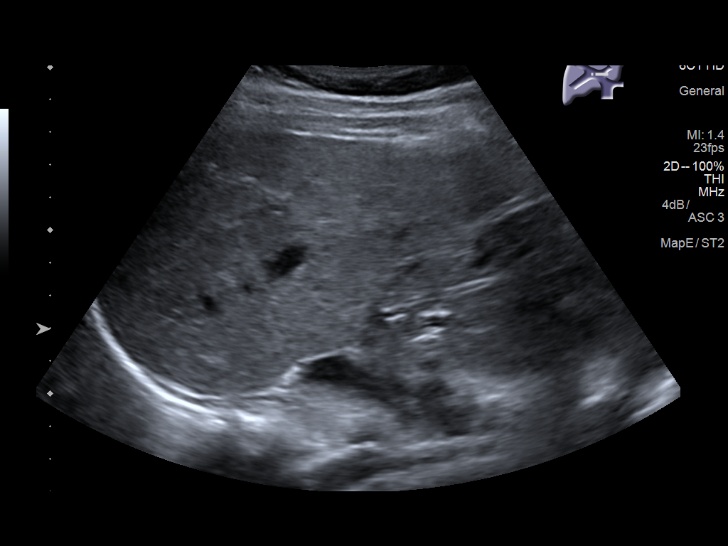
[im 30/36]
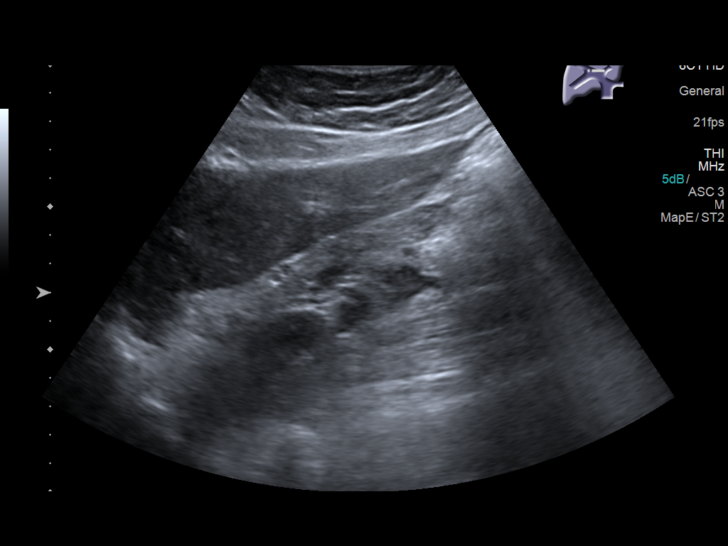
[im 33/36]
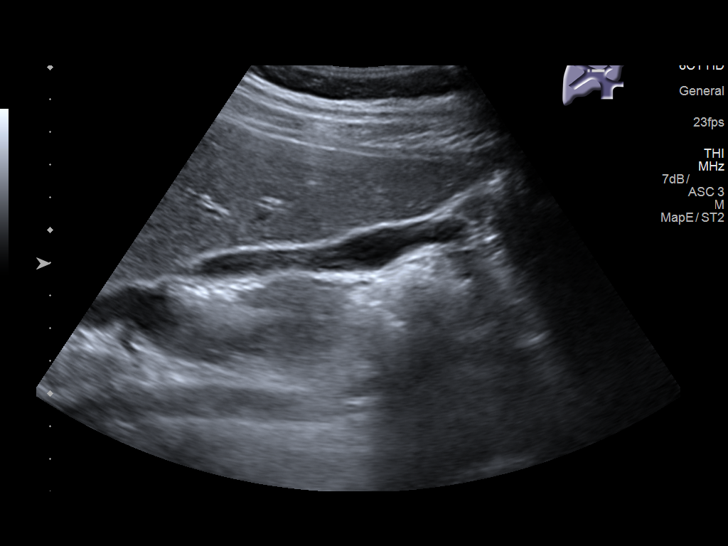
[im 36/36]
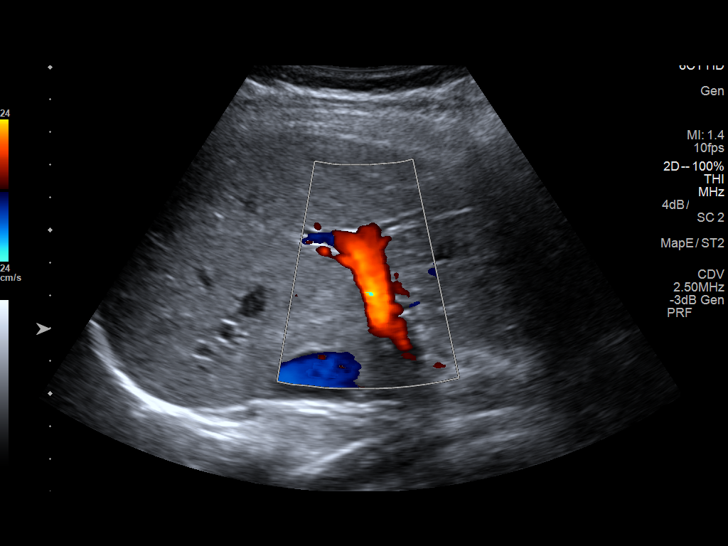

[14 of 25 positions shown; findings below may reference images not displayed]

FINDINGS: Gallbladder:

No gallstones or wall thickening visualized. No sonographic Murphy
sign noted by sonographer.

Common bile duct:

Diameter: Normal at 3 mm

Liver:

No focal lesion identified. Within normal limits in parenchymal
echogenicity.
IMPRESSION: No acute abdominal findings. Normal gallbladder. Normal RIGHT upper
quadrant ultrasound.

## 2018-04-24 IMAGING — CT CT ABD-PELV W/ CM
2 of 4 series · 17 of 46 positions shown, 19 images · IV contrast (ISOVUE)
Comparison: None.

CLINICAL DATA: Epigastric pain 3 weeks ago with increased pain
today. Nausea, vomiting and weight loss.

EXAM:
CT ABDOMEN AND PELVIS WITH CONTRAST
TECHNIQUE: Multidetector CT imaging of the abdomen and pelvis was performed
using the standard protocol following bolus administration of
intravenous contrast.
CONTRAST:  100mL [id number] IOPAMIDOL ([id number]) INJECTION 61%

[Series 2: rtn ap with st · axial · 0.60mm/px · z∈[-438,-68]mm · 14 of 84 slices shown, 16 images]
[im 5/84  soft-tissue]
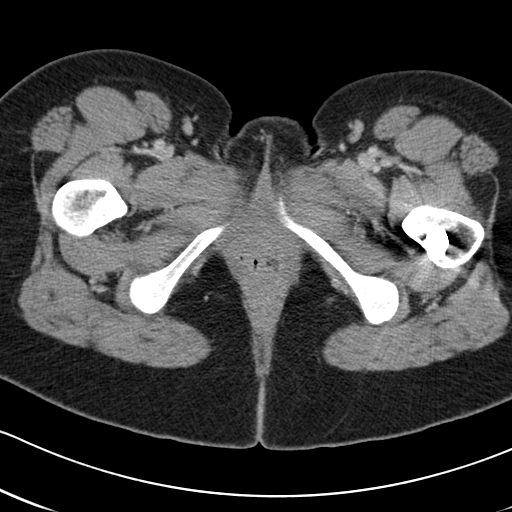
[im 5/84  bone]
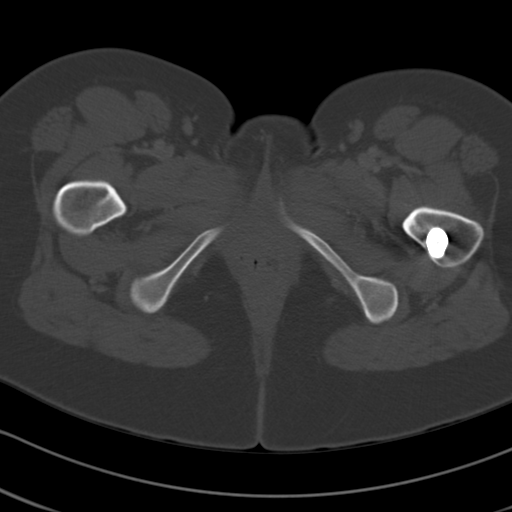
[im 10/84  soft-tissue]
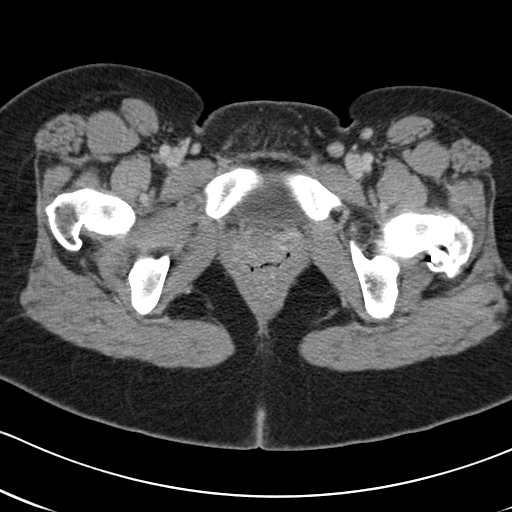
[im 19/84  soft-tissue]
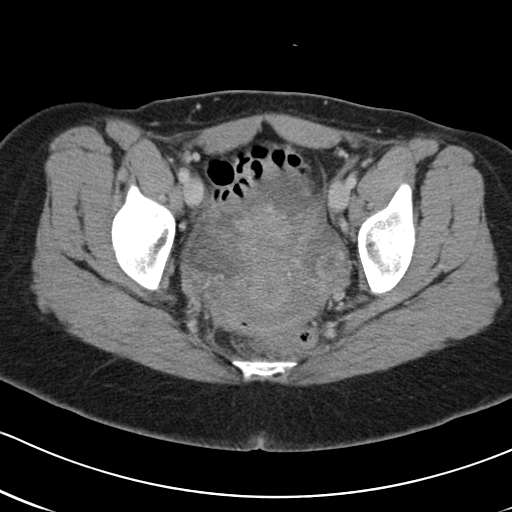
[im 24/84  soft-tissue]
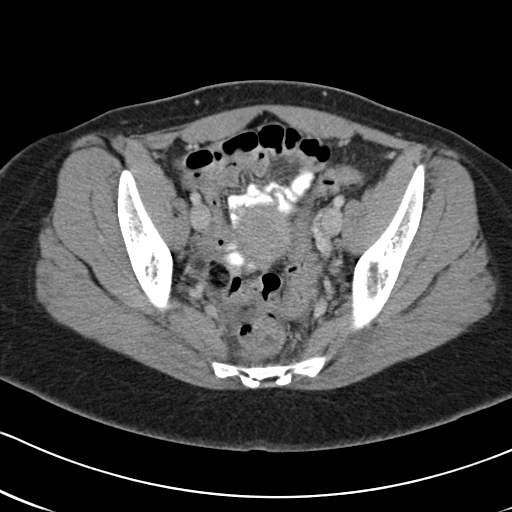
[im 28/84  soft-tissue]
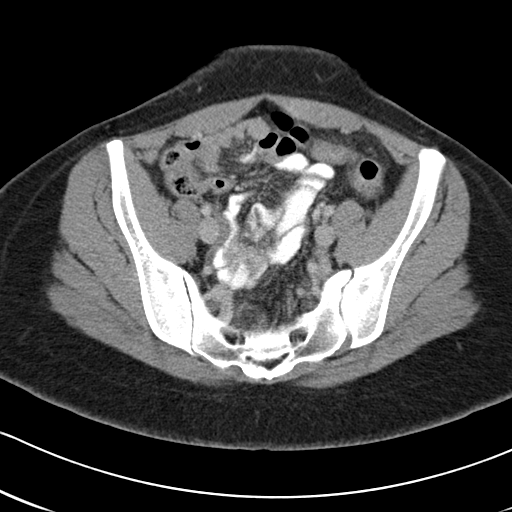
[im 33/84  soft-tissue]
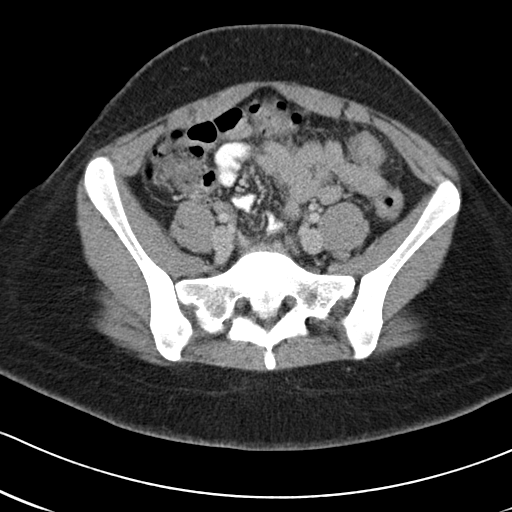
[im 37/84  soft-tissue]
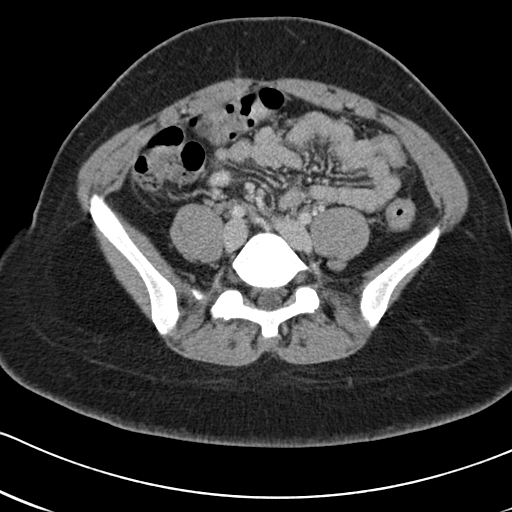
[im 47/84  soft-tissue]
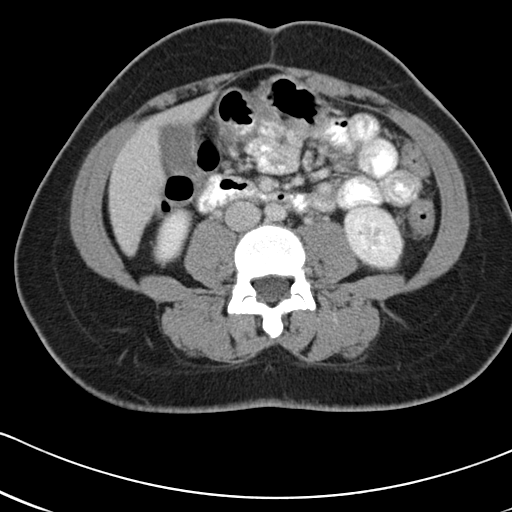
[im 51/84  soft-tissue]
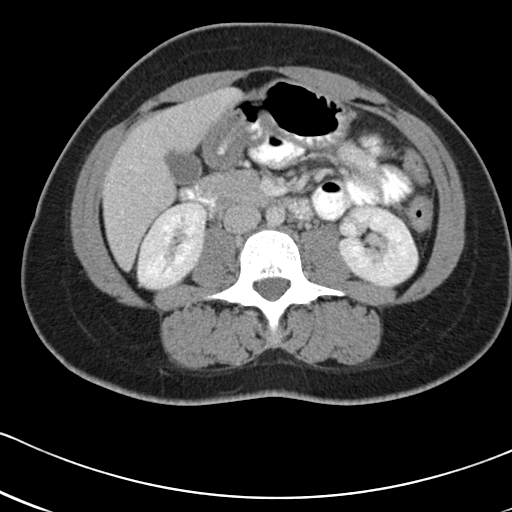
[im 51/84  bone]
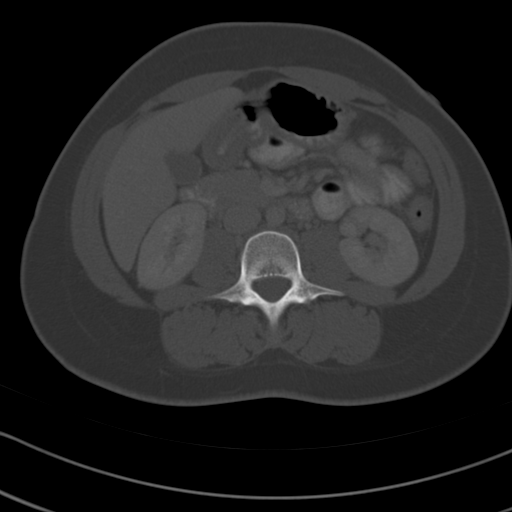
[im 56/84  soft-tissue]
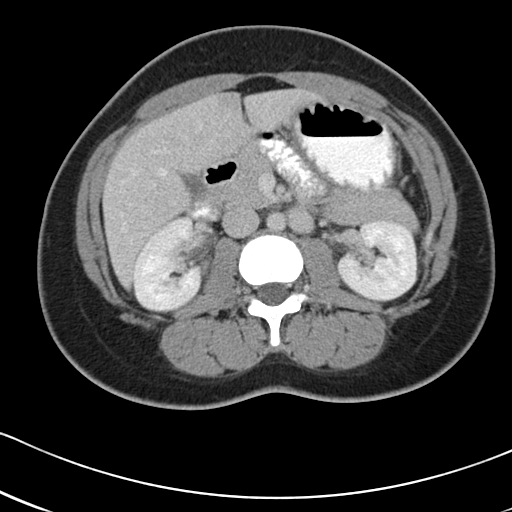
[im 60/84  soft-tissue]
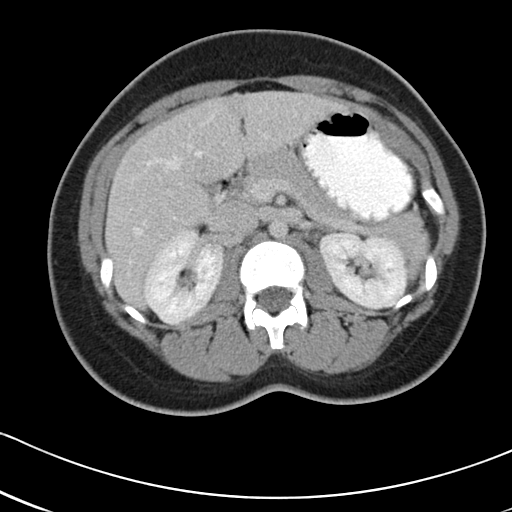
[im 65/84  soft-tissue]
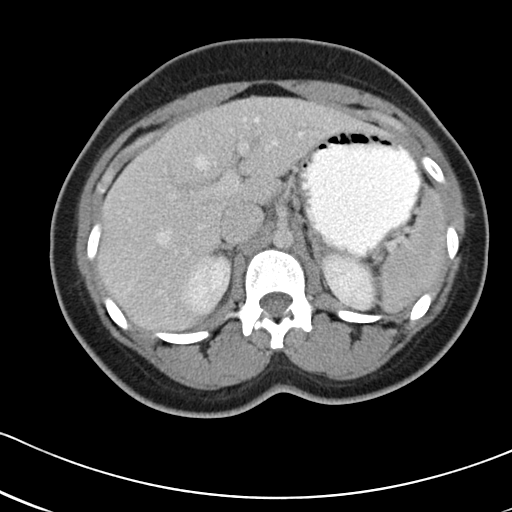
[im 74/84  soft-tissue]
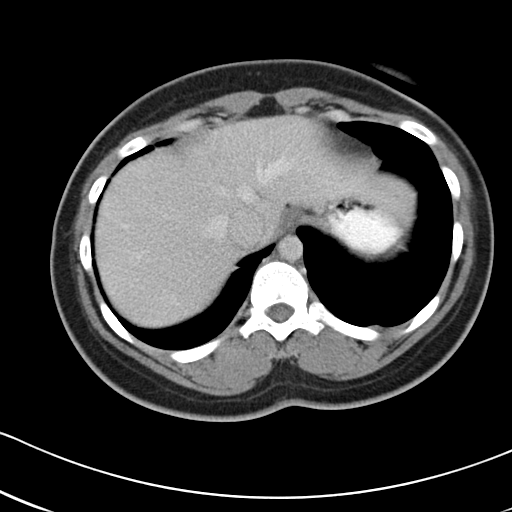
[im 79/84  soft-tissue]
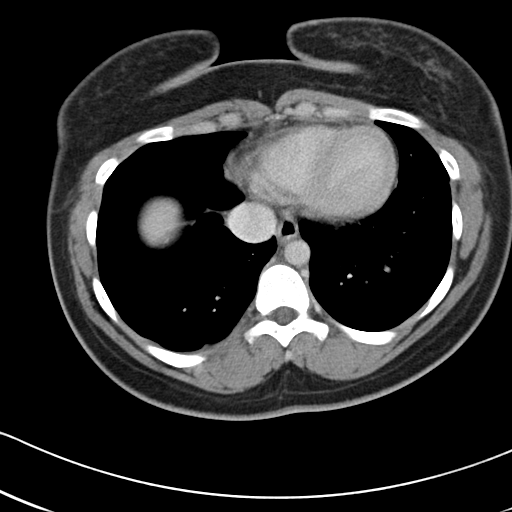

[Series 602: coronal images · coronal · 0.85mm/px · 3 of 117 slices shown]
[im 39/117  soft-tissue]
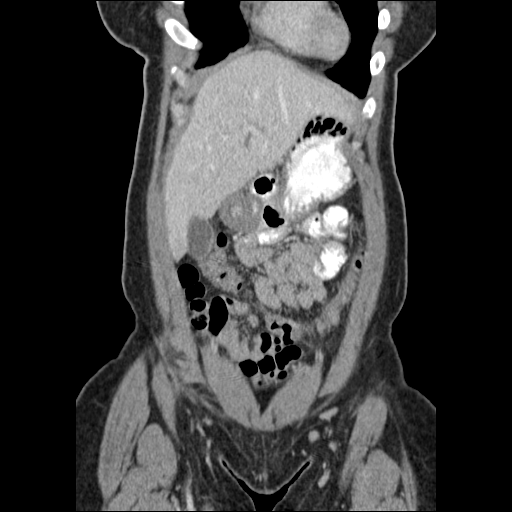
[im 52/117  soft-tissue]
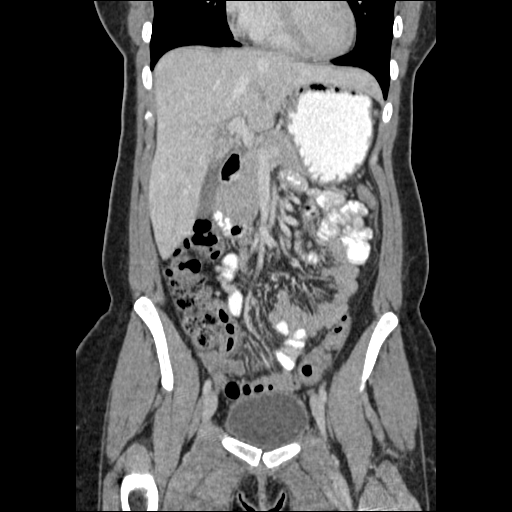
[im 65/117  soft-tissue]
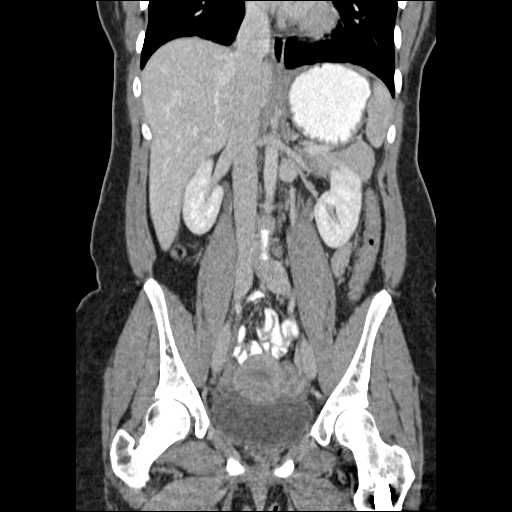

[17 of 46 positions shown; findings below may reference images not displayed]

FINDINGS: Lower chest: Lung bases show minimal dependent atelectasis. Heart
size normal. No pericardial or pleural effusion.

Hepatobiliary: Liver and gallbladder are unremarkable. No biliary
ductal dilatation.

Pancreas: Negative.

Spleen: Negative.

Adrenals/Urinary Tract: Adrenal glands and kidneys are unremarkable.
Ureters are decompressed. Bladder is unremarkable.

Stomach/Bowel: Stomach, small bowel appendix are unremarkable.
Questionable pericolonic haziness involving the descending colon.
Colon is otherwise unremarkable.

Vascular/Lymphatic: Vascular structures are unremarkable. No
pathologically enlarged lymph nodes.

Reproductive: Uterus and ovaries are visualized. Fat containing
lesion in the right ovary measures 2.9 cm.

Other: Tiny pelvic free fluid. Mesenteries and peritoneum are
otherwise unremarkable.

Musculoskeletal: An intra medullary rod is seen in the proximal left
femur. No worrisome lytic or sclerotic lesions.
IMPRESSION: 1. Questionable pericolonic haziness involving the descending colon.
Colitis cannot be excluded. No additional findings to explain the
patient's pain.
2. Right ovarian dermoid.

## 2018-06-23 ENCOUNTER — Telehealth: Payer: Self-pay | Admitting: Family Medicine

## 2018-06-23 ENCOUNTER — Encounter: Payer: Self-pay | Admitting: Family Medicine

## 2018-06-23 NOTE — Telephone Encounter (Signed)
Attempted to contact Pt, phone number on file not working.  

## 2018-06-23 NOTE — Telephone Encounter (Signed)
Letter printed to be sent certified.

## 2018-06-23 NOTE — Telephone Encounter (Signed)
I have been reviewing my list of patients and I noticed that you are well overdue for multiple follow-up issues including cervical cancer screening.  Please make a follow-up appointment with me to discuss your health and to get a checkup.  If you have care via OB/GYN please let me know.  If I am no longer your doctor please let me know.

## 2018-06-23 NOTE — Telephone Encounter (Signed)
Please send a letter via certified mail
